# Patient Record
Sex: Male | Born: 1955 | Race: White | Hispanic: No | Marital: Married | Smoking: Never smoker
Health system: Southern US, Community
[De-identification: ages and names within clinical notes are randomized; demographics above are authoritative.]

## PROBLEM LIST (undated history)

## (undated) DIAGNOSIS — C801 Malignant (primary) neoplasm, unspecified: Secondary | ICD-10-CM

## (undated) DIAGNOSIS — D510 Vitamin B12 deficiency anemia due to intrinsic factor deficiency: Secondary | ICD-10-CM

## (undated) HISTORY — DX: Vitamin B12 deficiency anemia due to intrinsic factor deficiency: D51.0

## (undated) HISTORY — PX: CHOLECYSTECTOMY: SHX55

## (undated) HISTORY — PX: KNEE ARTHROSCOPY: SUR90

## (undated) HISTORY — PX: XI ROBOTIC ASSISTED SIMPLE PROSTATECTOMY: SHX6713

## (undated) HISTORY — DX: Malignant (primary) neoplasm, unspecified: C80.1

---

## 2002-10-18 ENCOUNTER — Encounter: Payer: Self-pay | Admitting: Family Medicine

## 2002-10-18 ENCOUNTER — Ambulatory Visit (HOSPITAL_COMMUNITY): Admission: RE | Admit: 2002-10-18 | Discharge: 2002-10-18 | Payer: Self-pay | Admitting: Family Medicine

## 2006-08-19 ENCOUNTER — Ambulatory Visit (HOSPITAL_COMMUNITY): Admission: RE | Admit: 2006-08-19 | Discharge: 2006-08-19 | Payer: Self-pay | Admitting: Family Medicine

## 2006-08-27 ENCOUNTER — Ambulatory Visit: Payer: Self-pay | Admitting: Orthopedic Surgery

## 2006-08-31 ENCOUNTER — Ambulatory Visit: Payer: Self-pay | Admitting: Orthopedic Surgery

## 2006-09-01 ENCOUNTER — Ambulatory Visit (HOSPITAL_COMMUNITY): Admission: RE | Admit: 2006-09-01 | Discharge: 2006-09-01 | Payer: Self-pay | Admitting: Orthopedic Surgery

## 2006-09-03 ENCOUNTER — Ambulatory Visit: Payer: Self-pay | Admitting: Orthopedic Surgery

## 2006-09-04 ENCOUNTER — Ambulatory Visit (HOSPITAL_COMMUNITY): Admission: RE | Admit: 2006-09-04 | Discharge: 2006-09-04 | Payer: Self-pay | Admitting: Orthopedic Surgery

## 2006-09-04 ENCOUNTER — Ambulatory Visit: Payer: Self-pay | Admitting: Orthopedic Surgery

## 2006-09-04 ENCOUNTER — Encounter: Payer: Self-pay | Admitting: Orthopedic Surgery

## 2006-09-08 ENCOUNTER — Ambulatory Visit: Payer: Self-pay | Admitting: Orthopedic Surgery

## 2006-09-09 ENCOUNTER — Encounter (HOSPITAL_COMMUNITY): Admission: RE | Admit: 2006-09-09 | Discharge: 2006-10-09 | Payer: Self-pay | Admitting: Orthopedic Surgery

## 2006-09-23 ENCOUNTER — Ambulatory Visit: Payer: Self-pay | Admitting: Orthopedic Surgery

## 2006-10-13 ENCOUNTER — Encounter (HOSPITAL_COMMUNITY): Admission: RE | Admit: 2006-10-13 | Discharge: 2006-11-10 | Payer: Self-pay | Admitting: Orthopedic Surgery

## 2006-10-21 ENCOUNTER — Ambulatory Visit: Payer: Self-pay | Admitting: Orthopedic Surgery

## 2006-12-21 ENCOUNTER — Ambulatory Visit: Payer: Self-pay | Admitting: Orthopedic Surgery

## 2006-12-21 DIAGNOSIS — M1220 Villonodular synovitis (pigmented), unspecified site: Secondary | ICD-10-CM | POA: Insufficient documentation

## 2009-12-11 DIAGNOSIS — C801 Malignant (primary) neoplasm, unspecified: Secondary | ICD-10-CM

## 2009-12-11 HISTORY — DX: Malignant (primary) neoplasm, unspecified: C80.1

## 2010-02-20 ENCOUNTER — Ambulatory Visit (HOSPITAL_COMMUNITY)
Admission: RE | Admit: 2010-02-20 | Discharge: 2010-02-20 | Payer: Self-pay | Source: Home / Self Care | Attending: Urology | Admitting: Urology

## 2010-02-25 LAB — SURGICAL PCR SCREEN
MRSA, PCR: NEGATIVE
Staphylococcus aureus: NEGATIVE

## 2010-03-22 ENCOUNTER — Inpatient Hospital Stay (HOSPITAL_COMMUNITY): Payer: BC Managed Care – PPO | Attending: Urology

## 2010-03-22 LAB — CBC
HCT: 43.4 % (ref 39.0–52.0)
Hemoglobin: 15.2 g/dL (ref 13.0–17.0)
MCH: 30.4 pg (ref 26.0–34.0)
MCHC: 35 g/dL (ref 30.0–36.0)
MCV: 86.8 fL (ref 78.0–100.0)
Platelets: 267 10*3/uL (ref 150–400)
RBC: 5 MIL/uL (ref 4.22–5.81)
RDW: 12.6 % (ref 11.5–15.5)
WBC: 5.2 10*3/uL (ref 4.0–10.5)

## 2010-03-22 LAB — BASIC METABOLIC PANEL
BUN: 12 mg/dL (ref 6–23)
CO2: 30 mEq/L (ref 19–32)
Calcium: 9.7 mg/dL (ref 8.4–10.5)
Chloride: 104 mEq/L (ref 96–112)
Creatinine, Ser: 1.1 mg/dL (ref 0.4–1.5)
GFR calc Af Amer: >60 mL/min (ref >60)
GFR calc non Af Amer: >60 mL/min (ref >60)
Glucose, Bld: 99 mg/dL (ref 70–99)
Potassium: 4.8 mEq/L (ref 3.5–5.1)
Sodium: 140 mEq/L (ref 135–145)

## 2010-03-22 LAB — SURGICAL PCR SCREEN
MRSA, PCR: NEGATIVE
Staphylococcus aureus: NEGATIVE

## 2010-04-01 ENCOUNTER — Inpatient Hospital Stay (HOSPITAL_COMMUNITY)
Admission: RE | Admit: 2010-04-01 | Discharge: 2010-04-02 | DRG: 335 | Disposition: A | Discharge status: Home / Self Care | Payer: BC Managed Care – PPO | Source: Ambulatory Visit | Attending: Urology | Admitting: Urology

## 2010-04-01 ENCOUNTER — Other Ambulatory Visit: Payer: Self-pay | Admitting: Urology

## 2010-04-01 DIAGNOSIS — C61 Malignant neoplasm of prostate: Principal | ICD-10-CM | POA: Diagnosis present

## 2010-04-01 LAB — TYPE AND SCREEN
ABO/RH(D): B POS
Antibody Screen: NEGATIVE

## 2010-04-01 LAB — HEMOGLOBIN AND HEMATOCRIT, BLOOD
HCT: 39.3 % (ref 39.0–52.0)
Hemoglobin: 13.3 g/dL (ref 13.0–17.0)

## 2010-04-01 LAB — ABO/RH: ABO/RH(D): B POS

## 2010-04-02 LAB — HEMOGLOBIN AND HEMATOCRIT, BLOOD
HCT: 41.9 % (ref 39.0–52.0)
Hemoglobin: 14 g/dL (ref 13.0–17.0)

## 2010-04-06 NOTE — Op Note (Signed)
NAME:  Kenneth Fritz, Kenneth Fritz NO.:  [id number]  MEDICAL RECORD NO.:  [id number]           PATIENT TYPE:  I  LOCATION:  0009                         FACILITY:  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  PROCEDURE:  Robotic-assisted laparoscopic radical prostatectomy (bilateral nerve sparing).  SURGEON:  Heloise Purpura, MD  ASSISTANT:  Delia Chimes, NP  ANESTHESIA:  General.  COMPLICATIONS:  None.  ESTIMATED BLOOD LOSS:  50 cc.  INTRAVENOUS FLUIDS:  2 L of lactated Ringer's.  SPECIMENS:  Prostate seminal vesicles.  DISPOSITION:  Specimen to pathology.  DRAINS: 1. A 20-French Coude catheter. 2. A #19 Blake pelvic drain.  INDICATIONS:  Kenneth Fritz is a 55 year old gentleman with clinically localized prostate cancer.  After a discussion regarding management options for treatment, he elected to proceed with surgical therapy and the above procedure.  The potential risks, complications, and alternative treatment options were discussed in detail and informed consent was obtained.  DESCRIPTION OF PROCEDURE:  The patient was taken to the operating room and a general anesthetic was administered.  He was given preoperative antibiotics, placed in the dorsal lithotomy position, and prepped and draped in the usual sterile fashion.  Next, preoperative time-out was performed.  A Foley catheter was then inserted into the bladder.  A site was selected just superior to the umbilicus for placement of the camera port.  This was placed using a standard open Hassan technique which allowed entry into the peritoneal cavity under direct vision  without difficulty.  A pneumoperitoneum was then established after 12 mm port was placed and the 0-degrees lens was used to inspect the abdomen. There was no evidence of any intra-abdominal injuries or other abnormalities.  The remaining ports were then placed.  The 8-mm robotic ports were placed in the left lower quadrant, right lower quadrant, and far left lower quadrant.  A 5 mm port was placed in the right upper quadrant and a 12 mm port was placed in the far right lateral abdominal wall for laparoscopic assistance.  All ports were placed under direct vision without difficulty.  The surgical cart was then docked.  With the aid of the cautery scissors, the bladder was reflected posteriorly allowing entry into space of Retzius and identification of the endopelvic fascia and prostate.  The endopelvic fascia was incised from the apex back to the base of the prostate bilaterally and the underlying levator muscle fibers were swept laterally off the prostate thereby isolating the dorsal vein.  The dorsal venous complex was then stapled and divided with a 45-mm flex Echelon stapler.  The bladder neck was identified with the aid of Foley catheter manipulation and was divided anteriorly thereby exposing the Foley catheter.  The catheter balloon was deflated and the catheter was brought into the operative field and used to retract the  prostate anteriorly.  The posterior bladder neck was then divided and dissection proceeded between the bladder and prostate until the vasa deferentia and seminal vesicles were identified.  The vasa deferentia were isolated, divided and lifted anteriorly.  The seminal vesicles and dissected down to their tips with care to control the seminal vesicle arterial blood supply.  The seminal vesicles were then also lifted anteriorly in the space between Denonvilliers fascia and the anterior rectum was bluntly developed thereby isolating the vascular pedicles of prostate.   The lateral prostatic fascia was then sharply incised allowing preservation of the neurovascular bundles bilaterally.  The vascular pedicles of prostate were then ligated with Hem-o-lok clips above the level of the neurovascular bundles and divided with sharp cold scissor dissection.  The neurovascular bundles were then swept off the apex of the prostate and urethra and urethra was sharply transected allowing the prostate specimen to be disarticulated.  The pelvis was then copiously irrigated and hemostasis was ensured. Attention then turned to the urethral anastomosis.  A 2-0 Vicryl slip- knot was placed between Denonvilliers fascia, the poster bladder neck, the posterior urethra to reapproximate these structures.  A double-armed 3-0 Monocryl suture was then used to perform a 360-degree running tension-free anastomosis between the bladder neck and urethra.  A new 20- [place] Coude catheter was inserted into the bladder and irrigated. There were no blood clots within the bladder and the anastomosis appeared to be watertight.  A #19 Blake drain was then brought through the left robotic port and positioned appropriately within the pelvis. It was secured to the skin with a nylon suture.  The surgical cart was then undocked.  The right lateral 12-mm port site was closed with a 0 Vicryl suture placed laparoscopically.  The remaining ports were then removed under direct vision and the prostate specimen was removed intact within the Endopouch retrieval bag via the periumbilical port site. This fascial opening was then closed with two running 0 Vicryl sutures. All port sites were injected with 0.25% Marcaine and reapproximated at the skin level with staples.  Sterile dressings were applied.  The patient appeared to tolerate the procedure well and without complications.  He was able to be extubated and transferred to the recovery unit in satisfactory condition.     Heloise Purpura,  MD     LB/MEDQ  D:  04/01/2010  T:  04/01/2010  Job:  [id number]  Electronically Signed by Heloise Purpura MD on 04/06/2010 01:17:23 PM

## 2010-06-25 NOTE — Op Note (Signed)
NAME:  [name], [name] NO.:  [id number]   MEDICAL RECORD NO.:  [id number]          PATIENT TYPE:  AMB   LOCATION:  DAY                           FACILITY:  [facility]   PHYSICIAN:  Vickki Hearing, M.D.DATE OF BIRTH:  Aug 19, 1955   DATE OF PROCEDURE:  09/04/2006  DATE OF DISCHARGE:                               OPERATIVE REPORT   HISTORY:  55 year old male presented with acute onset of pain in his  left calf which then progressed to his left knee and was associated with  warmth, swelling, loss of motion, and a fever.  He was referred to me by  Dr. Gerda Diss.  My workup included a CBC, sed rate, C-reactive protein,  knee aspiration, fluid analysis, cell count and culture.  His white  count was normal.  His sed rate and C-reactive protein were elevated.  His cell count showed 13,000 white cells, 95% polys, no crystals and  culture negative. After aspiration, he did not improve.  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   PREOPERATIVE DIAGNOSIS:  Pigmented villonodular synovitis, left knee.   POSTOPERATIVE DIAGNOSIS:  Pigmented villonodular synovitis, left knee.   PROCEDURE:  Arthroscopic synovectomy, extensive, left knee.   ANESTHESIA:  General.   SURGEON:  Vickki Hearing, M.D.   ASSISTANT:  None.   COUNTS:  Sponge and needle counts were correct at the end of the  procedure.   DISPOSITION:  The patient went to the recovery room at the end of the  procedure in stable condition.   OPERATIVE FINDINGS:  There were two large pedunculated masses in the  knee consistent with PVNS with hemosiderin deposits. There was an  extensive exudate and synovitis in the knee, as well.   SPECIMEN:  Multiple biopsies were taken of the two lesions and sent for  pathologic identification.   ESTIMATED BLOOD LOSS:  Minimal.   TOURNIQUET TIME:  75 minutes.   The patient was identified as Kenneth Fritz.  He marked the left  knee as the  surgical site and was countersigned. History and physical  was updated.  The patient was taken to the operating room for general  anesthetic.  Antibiotics were started.  The left leg was prepped with  DuraPrep and draped sterilely.  The time out procedure was completed.  Diagnostic arthroscopy was done through a lateral portal.  A medial  portal was established and a probe was placed in the knee.  The intra-  articular structures were found to be normal except for some mild  chondral changes of the lateral femoral condyle.  There was a large  pedunculated lesion in the notch area and one in the suprapatellar area.  These were removed using a combination of instruments which included a  grasper, a shaver, a duckbill forceps, an ArthroCare 90 degrees wand  from the Gomer system.   After extensive debridement in the medial gutter, we carried this across  the notch to the lateral gutter and we completed the suprapatellar pouch  resection and then we irrigated the knee and closed with 3-0 nylon  suture.  We did an accessory superolateral lateral portal to access the  suprapatellar pouch.  The patient was placed in a sterile dressing and  Cryo/Cuff. The tourniquet was released, the color of the foot was good,  the capillary refill was less than 2 seconds.  The patient was taken to  the recovery room in stable condition.  He is weight bearing as  tolerated.  He is discharged on Vicodin and Phenergan.      Vickki Hearing, M.D.  Electronically Signed     SEH/MEDQ  D:  09/04/2006  T:  09/05/2006  Job:  [id number]

## 2010-06-25 NOTE — H&P (Signed)
NAME:  Kenneth Fritz, Kenneth Fritz NO.:  [id number]   MEDICAL RECORD NO.:  [id number]          PATIENT TYPE:  AMB   LOCATION:  DAY                           FACILITY:  [facility]   PHYSICIAN:  Vickki Hearing, M.D.DATE OF BIRTH:  1955/06/04   DATE OF ADMISSION:  09/04/2006  DATE OF DISCHARGE:  LH                              HISTORY & PHYSICAL   REFERRING PHYSICIAN:  Dr. Gerda Diss.   CHIEF COMPLAINT:  Pain and swelling, left knee.   HISTORY:  This is a 55 year old male with pain and swelling in the  posterior aspect of the left knee which started in his calf, then  progressed to the knee.  He has now had this for approximately a week  and a half.  He had a low grade fever.  His pain was rated a 10.  He had  an ultrasound which was negative.  He was given oral steroids and  Augmentin as well as ibuprofen, and he did not improve.  I aspirated his  knee, a cloudy nonpurulent fluid was sent for culture which was  negative.  Cell count analysis and crystal analysis:  The fluid came  back as 13,745 white cells, 95% neutrophils.  Again, no crystals.  Sed  rate 20.  C-reactive protein 2.7.  White count 7.4, granulocytes 75%.   I sent the patient for an MRI.  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  I spoke with Dr. Nicole Cella about this  at [facility].  He agreed an arthroscopic synovectomy is warranted.   REVIEW OF SYSTEMS:  GENERAL:  He did have a fever.  No chest pain, no  shortness of breath, no nausea, no hematuria, numbness, polydipsia,  depression, or skin cancer.  No known drug allergies.   PAST MEDICAL HISTORY:  He is B12 deficient.   SURGICAL HISTORY:  Cholecystectomy.   FAMILY HISTORY:  Cancer, diabetes.   SOCIAL HISTORY:  He is married.  He is a Retail banker.  He does  not smoke, drink, or use caffeine.   PHYSICAL EXAMINATION:  VITAL SIGNS:  Weight is approximately 153, pulse  96, respiratory rate 18.  APPEARANCE:  Normal.  CARDIOVASCULAR:  Showed normal pulses and no swelling.  LYMPH NODES:  Negative in the groin and neck.  SKIN:  Normal on 4 extremities.  PSYCH:  Normal mood, normal orientation.  NEUROLOGIC:  Normal reflexes, sensation, coordination.  MUSCULOSKELETAL:  He is limping.  He favors the left leg.  He does not  have full range of motion.  He has an effusion.  His ligaments were  intact.  Joint was tender, warm, swollen.  Upper extremities and right  lower extremity stability, strength, appearance were normal.   X-rays were negative.   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   PLAN:  Arthroscopic debridement synovectomy.  Most likely we will need  multiple portals.  Plan is for left knee arthroscopy.   DIAGNOSES:  Pigmented villonodular synovitis of the left knee.      Vickki Hearing, M.D.  Electronically Signed     SEH/MEDQ  D:  09/03/2006  T:  09/03/2006  Job:  [id number]   cc:   [facility]  Fax: [phone]

## 2010-11-25 LAB — CBC
HCT: 41.3
Hemoglobin: 14
MCHC: 33.9
MCV: 87
Platelets: 575 — ABNORMAL HIGH
RBC: 4.75
RDW: 12.6
WBC: 7.7

## 2012-05-10 ENCOUNTER — Encounter: Payer: Self-pay | Admitting: *Deleted

## 2012-05-12 ENCOUNTER — Ambulatory Visit (INDEPENDENT_AMBULATORY_CARE_PROVIDER_SITE_OTHER): Payer: BC Managed Care – PPO | Admitting: *Deleted

## 2012-05-12 DIAGNOSIS — D539 Nutritional anemia, unspecified: Secondary | ICD-10-CM

## 2012-05-12 MED ORDER — CYANOCOBALAMIN 1000 MCG/ML IJ SOLN
1000.0000 ug | Freq: Once | INTRAMUSCULAR | Status: AC
  Administered 2012-05-12: 1000 ug via INTRAMUSCULAR

## 2012-07-16 ENCOUNTER — Ambulatory Visit (INDEPENDENT_AMBULATORY_CARE_PROVIDER_SITE_OTHER): Payer: BC Managed Care – PPO

## 2012-07-16 DIAGNOSIS — D649 Anemia, unspecified: Secondary | ICD-10-CM

## 2012-07-16 MED ORDER — CYANOCOBALAMIN 1000 MCG/ML IJ SOLN
1000.0000 ug | Freq: Once | INTRAMUSCULAR | Status: AC
  Administered 2012-07-16: 1000 ug via INTRAMUSCULAR

## 2012-08-24 ENCOUNTER — Ambulatory Visit (INDEPENDENT_AMBULATORY_CARE_PROVIDER_SITE_OTHER): Payer: BC Managed Care – PPO

## 2012-08-24 DIAGNOSIS — D649 Anemia, unspecified: Secondary | ICD-10-CM

## 2012-08-24 MED ORDER — CYANOCOBALAMIN 1000 MCG/ML IJ SOLN
1000.0000 ug | Freq: Once | INTRAMUSCULAR | Status: AC
  Administered 2012-08-24: 1000 ug via INTRAMUSCULAR

## 2012-09-28 ENCOUNTER — Ambulatory Visit (INDEPENDENT_AMBULATORY_CARE_PROVIDER_SITE_OTHER): Payer: BC Managed Care – PPO | Admitting: *Deleted

## 2012-09-28 DIAGNOSIS — D649 Anemia, unspecified: Secondary | ICD-10-CM

## 2012-09-28 MED ORDER — CYANOCOBALAMIN 1000 MCG/ML IJ SOLN
1000.0000 ug | Freq: Once | INTRAMUSCULAR | Status: AC
  Administered 2012-09-28: 1000 ug via INTRAMUSCULAR

## 2012-10-29 ENCOUNTER — Ambulatory Visit: Payer: BC Managed Care – PPO

## 2012-11-02 ENCOUNTER — Ambulatory Visit (INDEPENDENT_AMBULATORY_CARE_PROVIDER_SITE_OTHER): Payer: BC Managed Care – PPO | Admitting: *Deleted

## 2012-11-02 DIAGNOSIS — D649 Anemia, unspecified: Secondary | ICD-10-CM

## 2012-11-02 MED ORDER — CYANOCOBALAMIN 1000 MCG/ML IJ SOLN
1000.0000 ug | Freq: Once | INTRAMUSCULAR | Status: AC
  Administered 2012-11-02: 1000 ug via INTRAMUSCULAR

## 2012-11-10 ENCOUNTER — Encounter: Payer: Self-pay | Admitting: Family Medicine

## 2012-11-10 DIAGNOSIS — C61 Malignant neoplasm of prostate: Secondary | ICD-10-CM | POA: Insufficient documentation

## 2012-12-21 ENCOUNTER — Ambulatory Visit (INDEPENDENT_AMBULATORY_CARE_PROVIDER_SITE_OTHER): Payer: BC Managed Care – PPO | Admitting: *Deleted

## 2012-12-21 DIAGNOSIS — D649 Anemia, unspecified: Secondary | ICD-10-CM

## 2012-12-21 MED ORDER — CYANOCOBALAMIN 1000 MCG/ML IJ SOLN
1000.0000 ug | Freq: Once | INTRAMUSCULAR | Status: AC
  Administered 2012-12-21: 1000 ug via INTRAMUSCULAR

## 2013-02-15 ENCOUNTER — Ambulatory Visit (INDEPENDENT_AMBULATORY_CARE_PROVIDER_SITE_OTHER): Payer: BC Managed Care – PPO | Admitting: *Deleted

## 2013-02-15 DIAGNOSIS — D510 Vitamin B12 deficiency anemia due to intrinsic factor deficiency: Secondary | ICD-10-CM

## 2013-02-15 MED ORDER — CYANOCOBALAMIN 1000 MCG/ML IJ SOLN
1000.0000 ug | Freq: Once | INTRAMUSCULAR | Status: AC
  Administered 2013-02-15: 1000 ug via INTRAMUSCULAR

## 2013-04-18 ENCOUNTER — Ambulatory Visit (INDEPENDENT_AMBULATORY_CARE_PROVIDER_SITE_OTHER): Payer: BC Managed Care – PPO | Admitting: Family Medicine

## 2013-04-18 ENCOUNTER — Encounter: Payer: Self-pay | Admitting: Family Medicine

## 2013-04-18 VITALS — BP 144/90 | Ht 67.0 in | Wt 168.0 lb

## 2013-04-18 DIAGNOSIS — R350 Frequency of micturition: Secondary | ICD-10-CM

## 2013-04-18 DIAGNOSIS — M549 Dorsalgia, unspecified: Secondary | ICD-10-CM

## 2013-04-18 DIAGNOSIS — D510 Vitamin B12 deficiency anemia due to intrinsic factor deficiency: Secondary | ICD-10-CM | POA: Insufficient documentation

## 2013-04-18 LAB — POCT URINALYSIS DIPSTICK
Glucose, UA: 50
Spec Grav, UA: 1.02
pH, UA: 5

## 2013-04-18 LAB — GLUCOSE, POCT (MANUAL RESULT ENTRY): POC Glucose: 101 mg/dl — AB (ref 70–99)

## 2013-04-18 MED ORDER — CIPROFLOXACIN HCL 500 MG PO TABS: 500.0000 mg | ORAL_TABLET | Freq: Two times a day (BID) | ORAL | Status: DC

## 2013-04-18 MED ORDER — CYANOCOBALAMIN 1000 MCG/ML IJ SOLN
1000.0000 ug | Freq: Once | INTRAMUSCULAR | Status: AC
  Administered 2013-04-18: 1000 ug via INTRAMUSCULAR

## 2013-04-18 NOTE — Progress Notes (Signed)
   Subjective:    Patient ID: Kenneth Fritz, male    DOB: 04/14/1955, 58 y.o.   MRN: [id number]  Urinary Frequency  This is a new problem. The current episode started in the past 7 days. The problem occurs every urination (Except at night). The patient is experiencing no pain. There has been no fever. Associated symptoms include frequency. He has tried nothing for the symptoms.   PMH above/pernicious anemia needs his B12 shot   Review of Systems  Genitourinary: Positive for frequency.   denies fever chills vomiting diarrhea     Objective:   Physical Exam 140/80 Lungs clear hearts regular abdomen soft no guarding or rebound      Assessment & Plan:  #1 pernicious anemia B12 shot given #2 probable UTI culture taken antibiotics prescribed

## 2013-04-20 LAB — URINE CULTURE
Colony Count: NO GROWTH
Organism ID, Bacteria: NO GROWTH

## 2013-06-17 ENCOUNTER — Ambulatory Visit (INDEPENDENT_AMBULATORY_CARE_PROVIDER_SITE_OTHER): Payer: BC Managed Care – PPO | Admitting: *Deleted

## 2013-06-17 DIAGNOSIS — D649 Anemia, unspecified: Secondary | ICD-10-CM

## 2013-06-17 MED ORDER — CYANOCOBALAMIN 1000 MCG/ML IJ SOLN
1000.0000 ug | Freq: Once | INTRAMUSCULAR | Status: AC
  Administered 2013-06-17: 1000 ug via INTRAMUSCULAR

## 2013-08-05 ENCOUNTER — Ambulatory Visit (INDEPENDENT_AMBULATORY_CARE_PROVIDER_SITE_OTHER): Payer: BC Managed Care – PPO | Admitting: *Deleted

## 2013-08-05 DIAGNOSIS — D649 Anemia, unspecified: Secondary | ICD-10-CM

## 2013-08-05 MED ORDER — CYANOCOBALAMIN 1000 MCG/ML IJ SOLN
1000.0000 ug | Freq: Once | INTRAMUSCULAR | Status: AC
  Administered 2013-08-05: 1000 ug via INTRAMUSCULAR

## 2013-09-15 ENCOUNTER — Ambulatory Visit (INDEPENDENT_AMBULATORY_CARE_PROVIDER_SITE_OTHER): Payer: BC Managed Care – PPO | Admitting: Family Medicine

## 2013-09-15 ENCOUNTER — Encounter: Payer: Self-pay | Admitting: Family Medicine

## 2013-09-15 VITALS — BP 138/90 | Temp 98.3°F | Ht 67.0 in | Wt 162.0 lb

## 2013-09-15 DIAGNOSIS — D510 Vitamin B12 deficiency anemia due to intrinsic factor deficiency: Secondary | ICD-10-CM

## 2013-09-15 DIAGNOSIS — H8110 Benign paroxysmal vertigo, unspecified ear: Secondary | ICD-10-CM

## 2013-09-15 DIAGNOSIS — H8113 Benign paroxysmal vertigo, bilateral: Secondary | ICD-10-CM

## 2013-09-15 MED ORDER — MECLIZINE HCL 25 MG PO TABS: 25.0000 mg | ORAL_TABLET | Freq: Three times a day (TID) | ORAL | Status: DC | PRN

## 2013-09-15 MED ORDER — CYANOCOBALAMIN 1000 MCG/ML IJ SOLN
1000.0000 ug | Freq: Once | INTRAMUSCULAR | Status: AC
  Administered 2013-09-15: 1000 ug via INTRAMUSCULAR

## 2013-09-15 NOTE — Progress Notes (Addendum)
   Subjective:    Patient ID: Kenneth Fritz, male    DOB: 01/30/1956, 58 y.o.   MRN: [id number]  Dizziness This is a recurrent (Has this about 2x per year) problem. The current episode started yesterday. The problem occurs intermittently. The problem has been gradually improving. Associated symptoms include myalgias and vertigo. Pertinent negatives include no chest pain, congestion, coughing, fatigue, fever, headaches, numbness or weakness. Associated symptoms comments: Ear congestion. Exacerbated by: laying down. He has tried nothing for the symptoms.   Worse while lying down and rolling over, improving today   Review of Systems  Constitutional: Negative for fever and fatigue.  HENT: Negative for congestion.   Respiratory: Negative for cough.   Cardiovascular: Negative for chest pain.  Musculoskeletal: Positive for myalgias.  Neurological: Positive for dizziness and vertigo. Negative for tremors, seizures, syncope, facial asymmetry, speech difficulty, weakness, light-headedness, numbness and headaches.  Hematological: Negative for adenopathy.  Psychiatric/Behavioral: Negative for behavioral problems.       Objective:   Physical Exam  Vitals reviewed. Constitutional: He is oriented to person, place, and time. He appears well-nourished. No distress.  HENT:  Head: Normocephalic.  Right Ear: External ear normal.  Left Ear: External ear normal.  Eyes: EOM are normal. Pupils are equal, round, and reactive to light.  Cardiovascular: Normal rate, regular rhythm and normal heart sounds.   No murmur heard. Pulmonary/Chest: Effort normal and breath sounds normal. No respiratory distress.  Musculoskeletal: He exhibits no edema.  Lymphadenopathy:    He has no cervical adenopathy.  Neurological: He is alert and oriented to person, place, and time. He has normal reflexes. He displays normal reflexes. No cranial nerve deficit. He exhibits normal muscle tone. Coordination normal.    Psychiatric: His behavior is normal.          Assessment & Plan:  BPV-vertigo- meclizine/epley manuver No stroke SX- warnings discussed Pt totally seen by me,Dr Sallee Lange

## 2013-10-28 ENCOUNTER — Ambulatory Visit (INDEPENDENT_AMBULATORY_CARE_PROVIDER_SITE_OTHER): Payer: BC Managed Care – PPO | Admitting: *Deleted

## 2013-10-28 DIAGNOSIS — D510 Vitamin B12 deficiency anemia due to intrinsic factor deficiency: Secondary | ICD-10-CM

## 2013-10-28 MED ORDER — CYANOCOBALAMIN 1000 MCG/ML IJ SOLN
1000.0000 ug | Freq: Once | INTRAMUSCULAR | Status: AC
  Administered 2013-10-28: 1000 ug via INTRAMUSCULAR

## 2013-12-16 ENCOUNTER — Ambulatory Visit (INDEPENDENT_AMBULATORY_CARE_PROVIDER_SITE_OTHER): Payer: BC Managed Care – PPO | Admitting: *Deleted

## 2013-12-16 DIAGNOSIS — D649 Anemia, unspecified: Secondary | ICD-10-CM

## 2013-12-16 MED ORDER — CYANOCOBALAMIN 1000 MCG/ML IJ SOLN
1000.0000 ug | Freq: Once | INTRAMUSCULAR | Status: AC
  Administered 2013-12-16: 1000 ug via INTRAMUSCULAR

## 2014-02-16 ENCOUNTER — Ambulatory Visit (INDEPENDENT_AMBULATORY_CARE_PROVIDER_SITE_OTHER): Payer: BLUE CROSS/BLUE SHIELD | Admitting: *Deleted

## 2014-02-16 DIAGNOSIS — D510 Vitamin B12 deficiency anemia due to intrinsic factor deficiency: Secondary | ICD-10-CM

## 2014-02-16 MED ORDER — CYANOCOBALAMIN 1000 MCG/ML IJ SOLN
1000.0000 ug | Freq: Once | INTRAMUSCULAR | Status: AC
  Administered 2014-02-16: 1000 ug via INTRAMUSCULAR

## 2014-04-06 ENCOUNTER — Ambulatory Visit (INDEPENDENT_AMBULATORY_CARE_PROVIDER_SITE_OTHER): Payer: BLUE CROSS/BLUE SHIELD | Admitting: *Deleted

## 2014-04-06 DIAGNOSIS — D649 Anemia, unspecified: Secondary | ICD-10-CM

## 2014-04-06 MED ORDER — CYANOCOBALAMIN 1000 MCG/ML IJ SOLN
1000.0000 ug | Freq: Once | INTRAMUSCULAR | Status: AC
  Administered 2014-04-06: 1000 ug via INTRAMUSCULAR

## 2014-05-16 ENCOUNTER — Ambulatory Visit (INDEPENDENT_AMBULATORY_CARE_PROVIDER_SITE_OTHER): Payer: BLUE CROSS/BLUE SHIELD | Admitting: *Deleted

## 2014-05-16 DIAGNOSIS — D510 Vitamin B12 deficiency anemia due to intrinsic factor deficiency: Secondary | ICD-10-CM

## 2014-05-16 MED ORDER — CYANOCOBALAMIN 1000 MCG/ML IJ SOLN
1000.0000 ug | Freq: Once | INTRAMUSCULAR | Status: AC
  Administered 2014-05-16: 1000 ug via INTRAMUSCULAR

## 2014-06-19 ENCOUNTER — Ambulatory Visit (INDEPENDENT_AMBULATORY_CARE_PROVIDER_SITE_OTHER): Payer: BLUE CROSS/BLUE SHIELD | Admitting: Family Medicine

## 2014-06-19 ENCOUNTER — Encounter: Payer: Self-pay | Admitting: Family Medicine

## 2014-06-19 VITALS — BP 132/80 | Temp 99.3°F | Ht 67.0 in | Wt 159.0 lb

## 2014-06-19 DIAGNOSIS — Z131 Encounter for screening for diabetes mellitus: Secondary | ICD-10-CM | POA: Diagnosis not present

## 2014-06-19 DIAGNOSIS — Z13220 Encounter for screening for lipoid disorders: Secondary | ICD-10-CM | POA: Diagnosis not present

## 2014-06-19 DIAGNOSIS — J019 Acute sinusitis, unspecified: Secondary | ICD-10-CM

## 2014-06-19 DIAGNOSIS — D510 Vitamin B12 deficiency anemia due to intrinsic factor deficiency: Secondary | ICD-10-CM | POA: Diagnosis not present

## 2014-06-19 DIAGNOSIS — B9689 Other specified bacterial agents as the cause of diseases classified elsewhere: Secondary | ICD-10-CM

## 2014-06-19 MED ORDER — CEFPROZIL 500 MG PO TABS: 500.0000 mg | ORAL_TABLET | Freq: Two times a day (BID) | ORAL | Status: DC

## 2014-06-19 MED ORDER — CYANOCOBALAMIN 1000 MCG/ML IJ SOLN
1000.0000 ug | Freq: Once | INTRAMUSCULAR | Status: AC
Start: 2014-06-19 — End: 2014-06-19
  Administered 2014-06-19: 1000 ug via INTRAMUSCULAR

## 2014-06-19 NOTE — Patient Instructions (Addendum)
Consider generic Loratadine 10 mg  Dear Patient,  It has been recommended to you that you have a colonoscopy. It is your responsibility to carry through with this recommendation.   Did you realize that colon cancer is the second leading cancer killer in the [place]. One in every 20 adults will get colon cancer. If all adults would go through the recommended screening for colon cancer (getting a colonoscopy), then there would be a 60% reduction in the number of people dying from colon cancer.  Colon cancer just doesn't come out of the blue. It starts off as a small polyp which over time grows into a cancer. A colonoscopy can prevent cancer and in many cases detected when it is at a very treatable phase. Small colon cancers can have cure rates of 95%. Advanced colon cancer, which often occurs in people who do not do their screenings, have cure rates less than 20%. The risk of colon cancer advances with age. Most adults should have regular colonoscopies every 10 years starting at age 82. This recommendation can vary depending on a person's medical history.  Health-care laws now allow for you to call the gastroenterologist office directly in order to set yourself up for this very important tests. Today we have recommended to you that you do this test. This test may save your life. Failure to do this test puts you at risk for premature death from colon cancer. Do the right thing and schedule this test now.  Here as a list of specialists we recommend in the surrounding area. When you call their office let them know that you are a patient of our practice in your interested in doing a screening colonoscopy. They should assist you without problems. You will need the following information when you called them: 1-name of which Dr. you see, 2-your insurance information, 3-a list of medications that you currently take, 4-any allergies you have to medications.  [facility] gastroenterologist Dr. Milton Ferguson, Dr  Felicie Morn gastroenterologist   [facility]   [phone]  [facility] gastroenterology (Dr. Garnetta Buddy and [place]) [phone]  [facility] gastroenterology (Dr. Leia Alf, Harrietta Guardian, [place]) [phone]  Each group of specialists has assured [place] that when you called them they will help you get your colonoscopy set up. Should you have problems please let us know. Be sure to call soon. Sincerely, Pearson Forster, Dr Mickie Hillier, [place]

## 2014-06-19 NOTE — Progress Notes (Signed)
   Subjective:    Patient ID: Kenneth Fritz, male    DOB: [birth year], 59 y.o.   MRN: [id number]  Cough This is a new problem. Episode onset: 3 days ago. Associated symptoms include headaches and rhinorrhea. Pertinent negatives include no chest pain, ear pain, fever or wheezing. Associated symptoms comments: Burning in chest, dizziness . Treatments tried: mucinex.   Patient due for his B12 shot   Review of Systems  Constitutional: Negative for fever and activity change.  HENT: Positive for congestion and rhinorrhea. Negative for ear pain.   Eyes: Negative for discharge.  Respiratory: Positive for cough. Negative for wheezing.   Cardiovascular: Negative for chest pain.  Neurological: Positive for headaches.       Objective:   Physical Exam  Constitutional: He appears well-developed.  HENT:  Head: Normocephalic.  Mouth/Throat: Oropharynx is clear and moist. No oropharyngeal exudate.  Neck: Normal range of motion.  Cardiovascular: Normal rate, regular rhythm and normal heart sounds.   No murmur heard. Pulmonary/Chest: Effort normal and breath sounds normal. He has no wheezes.  Lymphadenopathy:    He has no cervical adenopathy.  Neurological: He exhibits normal muscle tone.  Skin: Skin is warm and dry.  Nursing note and vitals reviewed.         Assessment & Plan:  Pernicious anemia B12 given Upper respiratory illness secondary sinusitis antibiotics prescribed warning signs discussed.

## 2014-06-20 ENCOUNTER — Ambulatory Visit: Payer: BLUE CROSS/BLUE SHIELD

## 2014-06-27 ENCOUNTER — Ambulatory Visit (INDEPENDENT_AMBULATORY_CARE_PROVIDER_SITE_OTHER): Payer: BLUE CROSS/BLUE SHIELD | Admitting: Family Medicine

## 2014-06-27 ENCOUNTER — Encounter: Payer: Self-pay | Admitting: Family Medicine

## 2014-06-27 VITALS — BP 144/86 | Temp 97.9°F | Ht 67.0 in | Wt 168.0 lb

## 2014-06-27 DIAGNOSIS — J019 Acute sinusitis, unspecified: Secondary | ICD-10-CM

## 2014-06-27 DIAGNOSIS — B9689 Other specified bacterial agents as the cause of diseases classified elsewhere: Secondary | ICD-10-CM

## 2014-06-27 DIAGNOSIS — J208 Acute bronchitis due to other specified organisms: Secondary | ICD-10-CM

## 2014-06-27 MED ORDER — AZITHROMYCIN 250 MG PO TABS: ORAL_TABLET | ORAL | Status: DC

## 2014-06-27 MED ORDER — HYDROCODONE-HOMATROPINE 5-1.5 MG/5ML PO SYRP: 5.0000 mL | ORAL_SOLUTION | Freq: Four times a day (QID) | ORAL | Status: DC | PRN

## 2014-06-27 NOTE — Progress Notes (Signed)
   Subjective:    Patient ID: Kenneth Fritz, male    DOB: May 08, 1955, 59 y.o.   MRN: [id number]  Cough This is a new problem. The current episode started 1 to 4 weeks ago. Associated symptoms include a fever, headaches, nasal congestion, rhinorrhea and a sore throat. Pertinent negatives include no chest pain, ear pain or wheezing. Associated symptoms comments: Antibiotic, Cough Syrup. Nothing aggravates the symptoms. Treatments tried: Antbiotic, Cough Syrup. The treatment provided no relief.      Review of Systems  Constitutional: Positive for fever. Negative for activity change.  HENT: Positive for congestion, rhinorrhea and sore throat. Negative for ear pain.   Eyes: Negative for discharge.  Respiratory: Positive for cough. Negative for wheezing.   Cardiovascular: Negative for chest pain.  Neurological: Positive for headaches.       Objective:   Physical Exam  Constitutional: He appears well-developed.  HENT:  Head: Normocephalic.  Mouth/Throat: Oropharynx is clear and moist. No oropharyngeal exudate.  Neck: Normal range of motion.  Cardiovascular: Normal rate, regular rhythm and normal heart sounds.   No murmur heard. Pulmonary/Chest: Effort normal and breath sounds normal. He has no wheezes.  Lymphadenopathy:    He has no cervical adenopathy.  Neurological: He exhibits normal muscle tone.  Skin: Skin is warm and dry.  Nursing note and vitals reviewed.         Assessment & Plan:  Viral syndrome Secondary sinusitis Second round of antibiotics Warning signs discussed Should gradually get better Cough medication for nighttime use caution drowsiness

## 2014-07-20 ENCOUNTER — Ambulatory Visit (INDEPENDENT_AMBULATORY_CARE_PROVIDER_SITE_OTHER): Payer: BLUE CROSS/BLUE SHIELD | Admitting: *Deleted

## 2014-07-20 DIAGNOSIS — D510 Vitamin B12 deficiency anemia due to intrinsic factor deficiency: Secondary | ICD-10-CM | POA: Diagnosis not present

## 2014-07-20 MED ORDER — CYANOCOBALAMIN 1000 MCG/ML IJ SOLN
1000.0000 ug | Freq: Once | INTRAMUSCULAR | Status: AC
  Administered 2014-07-20: 1000 ug via INTRAMUSCULAR

## 2014-09-06 ENCOUNTER — Ambulatory Visit (INDEPENDENT_AMBULATORY_CARE_PROVIDER_SITE_OTHER): Payer: BLUE CROSS/BLUE SHIELD | Admitting: *Deleted

## 2014-09-06 DIAGNOSIS — D649 Anemia, unspecified: Secondary | ICD-10-CM | POA: Diagnosis not present

## 2014-09-06 MED ORDER — CYANOCOBALAMIN 1000 MCG/ML IJ SOLN
1000.0000 ug | Freq: Once | INTRAMUSCULAR | Status: AC
  Administered 2014-09-06: 1000 ug via INTRAMUSCULAR

## 2014-10-19 ENCOUNTER — Ambulatory Visit (INDEPENDENT_AMBULATORY_CARE_PROVIDER_SITE_OTHER): Payer: BLUE CROSS/BLUE SHIELD | Admitting: *Deleted

## 2014-10-19 DIAGNOSIS — D649 Anemia, unspecified: Secondary | ICD-10-CM | POA: Diagnosis not present

## 2014-10-19 MED ORDER — CYANOCOBALAMIN 1000 MCG/ML IJ SOLN
1000.0000 ug | Freq: Once | INTRAMUSCULAR | Status: AC
  Administered 2014-10-19: 1000 ug via INTRAMUSCULAR

## 2014-11-22 ENCOUNTER — Ambulatory Visit (INDEPENDENT_AMBULATORY_CARE_PROVIDER_SITE_OTHER): Payer: BLUE CROSS/BLUE SHIELD

## 2014-11-22 DIAGNOSIS — D649 Anemia, unspecified: Secondary | ICD-10-CM | POA: Diagnosis not present

## 2014-11-22 MED ORDER — CYANOCOBALAMIN 1000 MCG/ML IJ SOLN
1000.0000 ug | Freq: Once | INTRAMUSCULAR | Status: AC
  Administered 2014-11-22: 1000 ug via INTRAMUSCULAR

## 2014-12-26 ENCOUNTER — Encounter: Payer: Self-pay | Admitting: Family Medicine

## 2014-12-26 ENCOUNTER — Ambulatory Visit (INDEPENDENT_AMBULATORY_CARE_PROVIDER_SITE_OTHER): Payer: BLUE CROSS/BLUE SHIELD | Admitting: Family Medicine

## 2014-12-26 VITALS — Temp 98.9°F | Ht 67.0 in | Wt 159.6 lb

## 2014-12-26 DIAGNOSIS — B9689 Other specified bacterial agents as the cause of diseases classified elsewhere: Secondary | ICD-10-CM

## 2014-12-26 DIAGNOSIS — J019 Acute sinusitis, unspecified: Secondary | ICD-10-CM

## 2014-12-26 MED ORDER — AMOXICILLIN-POT CLAVULANATE 875-125 MG PO TABS: 1.0000 | ORAL_TABLET | Freq: Two times a day (BID) | ORAL | Status: DC

## 2014-12-26 NOTE — Progress Notes (Signed)
   Subjective:    Patient ID: Kenneth Fritz, male    DOB: 1956/02/02, 59 y.o.   MRN: [id number]  Sinus Problem This is a new problem. The current episode started in the past 7 days. Associated symptoms include congestion, headaches, sinus pressure and a sore throat. Treatments tried: zyrtec, advil sinus.   [place] exposure  Dev sore throat  Head stopped up, sore throat didn't go away  stil stopped up   No hi fever, some nose running , used zyrtec and ad sin [place]  Non smoker  Review of Systems  HENT: Positive for congestion, sinus pressure and sore throat.   Neurological: Positive for headaches.   No high fever chills    Objective:   Physical Exam  Alert mild malaise frontal maxillary tenderness evident. Vitals reviewed. Pharynx some erythema neck supple lungs clear heart regular rate and rhythm.      Assessment & Plan:  Impression post viral rhinosinusitis plan antibiotics prescribed. Symptom care discussed. Warning signs discussed WSL

## 2015-01-09 ENCOUNTER — Ambulatory Visit (INDEPENDENT_AMBULATORY_CARE_PROVIDER_SITE_OTHER): Payer: BLUE CROSS/BLUE SHIELD | Admitting: *Deleted

## 2015-01-09 DIAGNOSIS — D649 Anemia, unspecified: Secondary | ICD-10-CM | POA: Diagnosis not present

## 2015-01-09 DIAGNOSIS — Z23 Encounter for immunization: Secondary | ICD-10-CM | POA: Diagnosis not present

## 2015-01-09 MED ORDER — CYANOCOBALAMIN 1000 MCG/ML IJ SOLN
1000.0000 ug | Freq: Once | INTRAMUSCULAR | Status: AC
  Administered 2015-01-09: 1000 ug via INTRAMUSCULAR

## 2015-03-22 ENCOUNTER — Ambulatory Visit (INDEPENDENT_AMBULATORY_CARE_PROVIDER_SITE_OTHER): Payer: BLUE CROSS/BLUE SHIELD

## 2015-03-22 DIAGNOSIS — D649 Anemia, unspecified: Secondary | ICD-10-CM | POA: Diagnosis not present

## 2015-03-22 MED ORDER — CYANOCOBALAMIN 1000 MCG/ML IJ SOLN
1000.0000 ug | Freq: Once | INTRAMUSCULAR | Status: AC
  Administered 2015-03-22: 1000 ug via INTRAMUSCULAR

## 2015-04-27 ENCOUNTER — Ambulatory Visit (INDEPENDENT_AMBULATORY_CARE_PROVIDER_SITE_OTHER): Payer: BLUE CROSS/BLUE SHIELD | Admitting: *Deleted

## 2015-04-27 DIAGNOSIS — D510 Vitamin B12 deficiency anemia due to intrinsic factor deficiency: Secondary | ICD-10-CM

## 2015-04-27 MED ORDER — CYANOCOBALAMIN 1000 MCG/ML IJ SOLN
1000.0000 ug | Freq: Once | INTRAMUSCULAR | Status: AC
  Administered 2015-04-27: 1000 ug via INTRAMUSCULAR

## 2015-05-01 ENCOUNTER — Encounter: Payer: Self-pay | Admitting: Orthopedic Surgery

## 2015-05-01 ENCOUNTER — Ambulatory Visit (INDEPENDENT_AMBULATORY_CARE_PROVIDER_SITE_OTHER): Payer: BLUE CROSS/BLUE SHIELD

## 2015-05-01 ENCOUNTER — Ambulatory Visit (INDEPENDENT_AMBULATORY_CARE_PROVIDER_SITE_OTHER): Payer: BLUE CROSS/BLUE SHIELD | Admitting: Orthopedic Surgery

## 2015-05-01 VITALS — BP 136/82 | HR 73 | Ht 67.0 in | Wt 163.2 lb

## 2015-05-01 DIAGNOSIS — M75102 Unspecified rotator cuff tear or rupture of left shoulder, not specified as traumatic: Secondary | ICD-10-CM | POA: Diagnosis not present

## 2015-05-01 DIAGNOSIS — M25512 Pain in left shoulder: Secondary | ICD-10-CM

## 2015-05-01 MED ORDER — NAPROXEN 500 MG PO TABS: 500.0000 mg | ORAL_TABLET | Freq: Two times a day (BID) | ORAL | Status: DC

## 2015-05-01 NOTE — Patient Instructions (Addendum)
Joint Injection  Care After  Refer to this sheet in the next few days. These instructions provide you with information on caring for yourself after you have had a joint injection. Your caregiver also may give you more specific instructions. Your treatment has been planned according to current medical practices, but problems sometimes occur. Call your caregiver if you have any problems or questions after your procedure.  After any type of joint injection, it is not uncommon to experience:  Soreness, swelling, or bruising around the injection site.  Mild numbness, tingling, or weakness around the injection site caused by the numbing medicine used before or with the injection. It also is possible to experience the following effects associated with the specific agent after injection:  Iodine-based contrast agents:  Allergic reaction (itching, hives, widespread redness, and swelling beyond the injection site).  Corticosteroids (These effects are rare.):  Allergic reaction.  Increased blood sugar levels (If you have diabetes and you notice that your blood sugar levels have increased, notify your caregiver).  Increased blood pressure levels.  Mood swings.  Hyaluronic acid in the use of viscosupplementation.  Temporary heat or redness.  Temporary rash and itching.  Increased fluid accumulation in the injected joint. These effects all should resolve within a day after your procedure.  HOME CARE INSTRUCTIONS  Limit yourself to light activity the day of your procedure. Avoid lifting heavy objects, bending, stooping, or twisting.  Take prescription or over-the-counter pain medication as directed by your caregiver.  You may apply ice to your injection site to reduce pain and swelling the day of your procedure. Ice may be applied 3-4 times:  Put ice in a plastic bag.  Place a towel between your skin and the bag.  Leave the ice on for no longer than 15-20 minutes each time. SEEK IMMEDIATE MEDICAL CARE IF:   Pain and swelling get worse rather than better or extend beyond the injection site.  Numbness does not go away.  Blood or fluid continues to leak from the injection site.  You have chest pain.  You have swelling of your face or tongue.  You have trouble breathing or you become dizzy.  You develop a fever, chills, or severe tenderness at the injection site that last longer than 1 day. MAKE SURE YOU:  Understand these instructions.  Watch your condition.  Get help right away if you are not doing well or if you get worse. Document Released: 10/10/2010 Document Revised: 04/21/2011 Document Reviewed: 10/10/2010  [facility] Patient Information 2014 [place].  Impingement Syndrome, Rotator Cuff, Bursitis With Rehab Impingement syndrome is a condition that involves inflammation of the tendons of the rotator cuff and the subacromial bursa, that causes pain in the shoulder. The rotator cuff consists of four tendons and muscles that control much of the shoulder and upper arm function. The subacromial bursa is a fluid filled sac that helps reduce friction between the rotator cuff and one of the bones of the shoulder (acromion). Impingement syndrome is usually an overuse injury that causes swelling of the bursa (bursitis), swelling of the tendon (tendonitis), and/or a tear of the tendon (strain). Strains are classified into three categories. Grade 1 strains cause pain, but the tendon is not lengthened. Grade 2 strains include a lengthened ligament, due to the ligament being stretched or partially ruptured. With grade 2 strains there is still function, although the function may be decreased. Grade 3 strains include a complete tear of the tendon or muscle, and function is usually impaired.  SYMPTOMS   Pain around the shoulder, often at the outer portion of the upper arm.  Pain that gets worse with shoulder function, especially when reaching overhead or lifting.  Sometimes, aching when not using the  arm.  Pain that wakes you up at night.  Sometimes, tenderness, swelling, warmth, or redness over the affected area.  Loss of strength.  Limited motion of the shoulder, especially reaching behind the back (to the back pocket or to unhook bra) or across your body.  Crackling sound (crepitation) when moving the arm.  Biceps tendon pain and inflammation (in the front of the shoulder). Worse when bending the elbow or lifting. CAUSES  Impingement syndrome is often an overuse injury, in which chronic (repetitive) motions cause the tendons or bursa to become inflamed. A strain occurs when a force is paced on the tendon or muscle that is greater than it can withstand. Common mechanisms of injury include: Stress from sudden increase in duration, frequency, or intensity of training.  Direct hit (trauma) to the shoulder.  Aging, erosion of the tendon with normal use.  Bony bump on shoulder (acromial spur). RISK INCREASES WITH:  Contact sports (football, wrestling, boxing).  Throwing sports (baseball, tennis, volleyball).  Weightlifting and bodybuilding.  Heavy labor.  Previous injury to the rotator cuff, including impingement.  Poor shoulder strength and flexibility.  Failure to warm up properly before activity.  Inadequate protective equipment.  Old age.  Bony bump on shoulder (acromial spur). PREVENTION   Warm up and stretch properly before activity.  Allow for adequate recovery between workouts.  Maintain physical fitness:  Strength, flexibility, and endurance.  Cardiovascular fitness.  Learn and use proper exercise technique. PROGNOSIS  If treated properly, impingement syndrome usually goes away within 6 weeks. Sometimes surgery is required.  RELATED COMPLICATIONS   Longer healing time if not properly treated, or if not given enough time to heal.  Recurring symptoms, that result in a chronic condition.  Shoulder stiffness, frozen shoulder, or loss of  motion.  Rotator cuff tendon tear.  Recurring symptoms, especially if activity is resumed too soon, with overuse, with a direct blow, or when using poor technique. TREATMENT  Treatment first involves the use of ice and medicine, to reduce pain and inflammation. The use of strengthening and stretching exercises may help reduce pain with activity. These exercises may be performed at home or with a therapist. If non-surgical treatment is unsuccessful after more than 6 months, surgery may be advised. After surgery and rehabilitation, activity is usually possible in 3 months.  MEDICATION  If pain medicine is needed, nonsteroidal anti-inflammatory medicines (aspirin and ibuprofen), or other minor pain relievers (acetaminophen), are often advised.  Do not take pain medicine for 7 days before surgery.  Prescription pain relievers may be given, if your caregiver thinks they are needed. Use only as directed and only as much as you need.  Corticosteroid injections may be given by your caregiver. These injections should be reserved for the most serious cases, because they may only be given a certain number of times. HEAT AND COLD  Cold treatment (icing) should be applied for 10 to 15 minutes every 2 to 3 hours for inflammation and pain, and immediately after activity that aggravates your symptoms. Use ice packs or an ice massage.  Heat treatment may be used before performing stretching and strengthening activities prescribed by your caregiver, physical therapist, or athletic trainer. Use a heat pack or a warm water soak. SEEK MEDICAL CARE IF:   Symptoms  get worse or do not improve in 4 to 6 weeks, despite treatment.  New, unexplained symptoms develop. (Drugs used in treatment may produce side effects.) EXERCISES:   Do each exercise 15 times Hold the position for 2 sec Do each exercise once a day   RANGE OF MOTION (ROM) AND STRETCHING EXERCISES - Impingement Syndrome (Rotator Cuff  Tendinitis,  Bursitis) These exercises may help you when beginning to rehabilitate your injury. Your symptoms may go away with or without further involvement from your physician, physical therapist or athletic trainer. While completing these exercises, remember:   Restoring tissue flexibility helps normal motion to return to the joints. This allows healthier, less painful movement and activity.  An effective stretch should be held for at least 30 seconds.  A stretch should never be painful. You should only feel a gentle lengthening or release in the stretched tissue. STRETCH - Flexion, Standing  Stand with good posture. With an underhand grip on your right / left hand, and an overhand grip on the opposite hand, grasp a broomstick or cane so that your hands are a little more than shoulder width apart.  Keeping your right / left elbow straight and shoulder muscles relaxed, push the stick with your opposite hand, to raise your right / left arm in front of your body and then overhead. Raise your arm until you feel a stretch in your right / left shoulder, but before you have increased shoulder pain.  Try to avoid shrugging your right / left shoulder as your arm rises, by keeping your shoulder blade tucked down and toward your mid-back spine. Hold for __________ seconds.  Slowly return to the starting position. Repeat __________ times. Complete this exercise __________ times per day. STRETCH - Abduction, Supine  Lie on your back. With an underhand grip on your right / left hand and an overhand grip on the opposite hand, grasp a broomstick or cane so that your hands are a little more than shoulder width apart.  Keeping your right / left elbow straight and your shoulder muscles relaxed, push the stick with your opposite hand, to raise your right / left arm out to the side of your body and then overhead. Raise your arm until you feel a stretch in your right / left shoulder, but before you have increased shoulder  pain.  Try to avoid shrugging your right / left shoulder as your arm rises, by keeping your shoulder blade tucked down and toward your mid-back spine. Hold for __________ seconds.  Slowly return to the starting position. Repeat __________ times. Complete this exercise __________ times per day. ROM - Flexion, Active-Assisted  Lie on your back. You may bend your knees for comfort.  Grasp a broomstick or cane so your hands are about shoulder width apart. Your right / left hand should grip the end of the stick, so that your hand is positioned "thumbs-up," as if you were about to shake hands.  Using your healthy arm to lead, raise your right / left arm overhead, until you feel a gentle stretch in your shoulder. Hold for __________ seconds.  Use the stick to assist in returning your right / left arm to its starting position. Repeat __________ times. Complete this exercise __________ times per day.  ROM - Internal Rotation, Supine   Lie on your back on a firm surface. Place your right / left elbow about 60 degrees away from your side. Elevate your elbow with a folded towel, so that the elbow and shoulder  are the same height.  Using a broomstick or cane and your strong arm, pull your right / left hand toward your body until you feel a gentle stretch, but no increase in your shoulder pain. Keep your shoulder and elbow in place throughout the exercise.  Hold for __________ seconds. Slowly return to the starting position. Repeat __________ times. Complete this exercise __________ times per day. STRETCH - Internal Rotation  Place your right / left hand behind your back, palm up.  Throw a towel or belt over your opposite shoulder. Grasp the towel with your right / left hand.  While keeping an upright posture, gently pull up on the towel, until you feel a stretch in the front of your right / left shoulder.  Avoid shrugging your right / left shoulder as your arm rises, by keeping your shoulder blade  tucked down and toward your mid-back spine.  Hold for __________ seconds. Release the stretch, by lowering your healthy hand. Repeat __________ times. Complete this exercise __________ times per day. ROM - Internal Rotation   Using an underhand grip, grasp a stick behind your back with both hands.  While standing upright with good posture, slide the stick up your back until you feel a mild stretch in the front of your shoulder.  Hold for __________ seconds. Slowly return to your starting position. Repeat __________ times. Complete this exercise __________ times per day.  STRETCH - Posterior Shoulder Capsule   Stand or sit with good posture. Grasp your right / left elbow and draw it across your chest, keeping it at the same height as your shoulder.  Pull your elbow, so your upper arm comes in closer to your chest. Pull until you feel a gentle stretch in the back of your shoulder.  Hold for __________ seconds. Repeat __________ times. Complete this exercise __________ times per day. STRENGTHENING EXERCISES - Impingement Syndrome (Rotator Cuff Tendinitis, Bursitis) These exercises may help you when beginning to rehabilitate your injury. They may resolve your symptoms with or without further involvement from your physician, physical therapist or athletic trainer. While completing these exercises, remember:  Muscles can gain both the endurance and the strength needed for everyday activities through controlled exercises.  Complete these exercises as instructed by your physician, physical therapist or athletic trainer. Increase the resistance and repetitions only as guided.  You may experience muscle soreness or fatigue, but the pain or discomfort you are trying to eliminate should never worsen during these exercises. If this pain does get worse, stop and make sure you are following the directions exactly. If the pain is still present after adjustments, discontinue the exercise until you can  discuss the trouble with your clinician.  During your recovery, avoid activity or exercises which involve actions that place your injured hand or elbow above your head or behind your back or head. These positions stress the tissues which you are trying to heal. STRENGTH - Scapular Depression and Adduction   With good posture, sit on a firm chair. Support your arms in front of you, with pillows, arm rests, or on a table top. Have your elbows in line with the sides of your body.  Gently draw your shoulder blades down and toward your mid-back spine. Gradually increase the tension, without tensing the muscles along the top of your shoulders and the back of your neck.  Hold for __________ seconds. Slowly release the tension and relax your muscles completely before starting the next repetition.  After you have practiced this exercise,  remove the arm support and complete the exercise in standing as well as sitting position. Repeat __________ times. Complete this exercise __________ times per day.  STRENGTH - Shoulder Abductors, Isometric  With good posture, stand or sit about 4-6 inches from a wall, with your right / left side facing the wall.  Bend your right / left elbow. Gently press your right / left elbow into the wall. Increase the pressure gradually, until you are pressing as hard as you can, without shrugging your shoulder or increasing any shoulder discomfort.  Hold for __________ seconds.  Release the tension slowly. Relax your shoulder muscles completely before you begin the next repetition. Repeat __________ times. Complete this exercise __________ times per day.  STRENGTH - External Rotators, Isometric  Keep your right / left elbow at your side and bend it 90 degrees.  Step into a door frame so that the outside of your right / left wrist can press against the door frame without your upper arm leaving your side.  Gently press your right / left wrist into the door frame, as if you  were trying to swing the back of your hand away from your stomach. Gradually increase the tension, until you are pressing as hard as you can, without shrugging your shoulder or increasing any shoulder discomfort.  Hold for __________ seconds.  Release the tension slowly. Relax your shoulder muscles completely before you begin the next repetition. Repeat __________ times. Complete this exercise __________ times per day.  STRENGTH - Supraspinatus   Stand or sit with good posture. Grasp a __________ weight, or an exercise band or tubing, so that your hand is "thumbs-up," like you are shaking hands.  Slowly lift your right / left arm in a "V" away from your thigh, diagonally into the space between your side and straight ahead. Lift your hand to shoulder height or as far as you can, without increasing any shoulder pain. At first, many people do not lift their hands above shoulder height.  Avoid shrugging your right / left shoulder as your arm rises, by keeping your shoulder blade tucked down and toward your mid-back spine.  Hold for __________ seconds. Control the descent of your hand, as you slowly return to your starting position. Repeat __________ times. Complete this exercise __________ times per day.  STRENGTH - External Rotators  Secure a rubber exercise band or tubing to a fixed object (table, pole) so that it is at the same height as your right / left elbow when you are standing or sitting on a firm surface.  Stand or sit so that the secured exercise band is at your uninjured side.  Bend your right / left elbow 90 degrees. Place a folded towel or small pillow under your right / left arm, so that your elbow is a few inches away from your side.  Keeping the tension on the exercise band, pull it away from your body, as if pivoting on your elbow. Be sure to keep your body steady, so that the movement is coming only from your rotating shoulder.  Hold for __________ seconds. Release the  tension in a controlled manner, as you return to the starting position. Repeat __________ times. Complete this exercise __________ times per day.  STRENGTH - Internal Rotators   Secure a rubber exercise band or tubing to a fixed object (table, pole) so that it is at the same height as your right / left elbow when you are standing or sitting on a firm surface.  Stand or sit so that the secured exercise band is at your right / left side.  Bend your elbow 90 degrees. Place a folded towel or small pillow under your right / left arm so that your elbow is a few inches away from your side.  Keeping the tension on the exercise band, pull it across your body, toward your stomach. Be sure to keep your body steady, so that the movement is coming only from your rotating shoulder.  Hold for __________ seconds. Release the tension in a controlled manner, as you return to the starting position. Repeat __________ times. Complete this exercise __________ times per day.  STRENGTH - Scapular Protractors, Standing   Stand arms length away from a wall. Place your hands on the wall, keeping your elbows straight.  Begin by dropping your shoulder blades down and toward your mid-back spine.  To strengthen your protractors, keep your shoulder blades down, but slide them forward on your rib cage. It will feel as if you are lifting the back of your rib cage away from the wall. This is a subtle motion and can be challenging to complete. Ask your caregiver for further instruction, if you are not sure you are doing the exercise correctly.  Hold for __________ seconds. Slowly return to the starting position, resting the muscles completely before starting the next repetition. Repeat __________ times. Complete this exercise __________ times per day. STRENGTH - Scapular Protractors, Supine  Lie on your back on a firm surface. Extend your right / left arm straight into the air while holding a __________ weight in your  hand.  Keeping your head and back in place, lift your shoulder off the floor.  Hold for __________ seconds. Slowly return to the starting position, and allow your muscles to relax completely before starting the next repetition. Repeat __________ times. Complete this exercise __________ times per day. STRENGTH - Scapular Protractors, Quadruped  Get onto your hands and knees, with your shoulders directly over your hands (or as close as you can be, comfortably).  Keeping your elbows locked, lift the back of your rib cage up into your shoulder blades, so your mid-back rounds out. Keep your neck muscles relaxed.  Hold this position for __________ seconds. Slowly return to the starting position and allow your muscles to relax completely before starting the next repetition. Repeat __________ times. Complete this exercise __________ times per day.  STRENGTH - Scapular Retractors  Secure a rubber exercise band or tubing to a fixed object (table, pole), so that it is at the height of your shoulders when you are either standing, or sitting on a firm armless chair.  With a palm down grip, grasp an end of the band in each hand. Straighten your elbows and lift your hands straight in front of you, at shoulder height. Step back, away from the secured end of the band, until it becomes tense.  Squeezing your shoulder blades together, draw your elbows back toward your sides, as you bend them. Keep your upper arms lifted away from your body throughout the exercise.  Hold for __________ seconds. Slowly ease the tension on the band, as you reverse the directions and return to the starting position. Repeat __________ times. Complete this exercise __________ times per day. STRENGTH - Shoulder Extensors   Secure a rubber exercise band or tubing to a fixed object (table, pole) so that it is at the height of your shoulders when you are either standing, or sitting on a firm armless chair.  With a thumbs-up grip,  grasp an end of the band in each hand. Straighten your elbows and lift your hands straight in front of you, at shoulder height. Step back, away from the secured end of the band, until it becomes tense.  Squeezing your shoulder blades together, pull your hands down to the sides of your thighs. Do not allow your hands to go behind you.  Hold for __________ seconds. Slowly ease the tension on the band, as you reverse the directions and return to the starting position. Repeat __________ times. Complete this exercise __________ times per day.  STRENGTH - Scapular Retractors and External Rotators   Secure a rubber exercise band or tubing to a fixed object (table, pole) so that it is at the height as your shoulders, when you are either standing, or sitting on a firm armless chair.  With a palm down grip, grasp an end of the band in each hand. Bend your elbows 90 degrees and lift your elbows to shoulder height, at your sides. Step back, away from the secured end of the band, until it becomes tense.  Squeezing your shoulder blades together, rotate your shoulders so that your upper arms and elbows remain stationary, but your fists travel upward to head height.  Hold for __________ seconds. Slowly ease the tension on the band, as you reverse the directions and return to the starting position. Repeat __________ times. Complete this exercise __________ times per day.  STRENGTH - Scapular Retractors and External Rotators, Rowing   Secure a rubber exercise band or tubing to a fixed object (table, pole) so that it is at the height of your shoulders, when you are either standing, or sitting on a firm armless chair.  With a palm down grip, grasp an end of the band in each hand. Straighten your elbows and lift your hands straight in front of you, at shoulder height. Step back, away from the secured end of the band, until it becomes tense.  Step 1: Squeeze your shoulder blades together. Bending your elbows, draw  your hands to your chest, as if you are rowing a boat. At the end of this motion, your hands and elbow should be at shoulder height and your elbows should be out to your sides.  Step 2: Rotate your shoulders, to raise your hands above your head. Your forearms should be vertical and your upper arms should be horizontal.  Hold for __________ seconds. Slowly ease the tension on the band, as you reverse the directions and return to the starting position. Repeat __________ times. Complete this exercise __________ times per day.  STRENGTH - Scapular Depressors  Find a sturdy chair without wheels, such as a dining room chair.  Keeping your feet on the floor, and your hands on the chair arms, lift your bottom up from the seat, and lock your elbows.  Keeping your elbows straight, allow gravity to pull your body weight down. Your shoulders will rise toward your ears.  Raise your body against gravity by drawing your shoulder blades down your back, shortening the distance between your shoulders and ears. Although your feet should always maintain contact with the floor, your feet should progressively support less body weight, as you get stronger.  Hold for __________ seconds. In a controlled and slow manner, lower your body weight to begin the next repetition. Repeat __________ times. Complete this exercise __________ times per day.    This information is not intended to replace advice given to you by your health  care provider. Make sure you discuss any questions you have with your health care provider.   Document Released: 01/27/2005 Document Revised: 02/17/2014 Document Reviewed: 05/11/2008 Elsevier Interactive Patient Education [employer].

## 2015-05-01 NOTE — Progress Notes (Signed)
Patient ID: Kenneth Fritz, male   DOB: 11/10/1955, 60 y.o.   MRN: [id number]  Chief Complaint  Patient presents with  . Shoulder Pain    left shoulder pain    HPI Kenneth Fritz is a 60 y.o. male.   Shoulder Pain  The pain is present in the left shoulder. This is a new problem. The current episode started more than 1 month ago. There has been no history of extremity trauma. The problem occurs constantly. The problem has been gradually worsening. The quality of the pain is described as sharp. The pain is at a severity of 7/10. Associated symptoms include a limited range of motion and stiffness. Pertinent negatives include no fever, itching, joint locking, joint swelling, numbness or tingling. The symptoms are aggravated by activity, cold and lying down. He has tried nothing for the symptoms. Family history does not include gout or rheumatoid arthritis. There is no history of diabetes, gout, osteoarthritis or rheumatoid arthritis.     Review of Systems Review of Systems  Constitutional: Negative for fever.  Musculoskeletal: Positive for arthralgias and stiffness. Negative for gout.  Skin: Negative for itching.  Neurological: Negative for tingling and numbness.  All other systems reviewed and are negative.   Past Medical History  Diagnosis Date  . 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 Pernicious anemia     Knee arthroscopy for PVNS  Prostate surgery   Family History  Problem Relation Age of Onset  . Diabetes Mother     Social History Social History  Substance Use Topics  . Smoking status: Never Smoker   . Smokeless tobacco: None  . Alcohol Use: None    No Known Allergies  No current outpatient prescriptions on file.   No current facility-administered medications for this visit.    Physical Exam Physical Exam Blood pressure 136/82, pulse 73, height 5\' 7"  (1.702 m), weight 163 lb 3.2 oz (74.027 kg). Appearance, there are no abnormalities in terms of appearance the  patient was well-developed and well-nourished. The grooming and hygiene were normal.  Mental status orientation, there was normal alertness and orientation Mood pleasant Ambulatory status normal with no assistive devices  Examination of the LEFT shoulder reveals tenderness in the peri-acromial region AND ROTATOR INTERVAL  Range of motion flexion = 150 Range of motion external rotation = 45 Range of motion internal rotation = T12  The patient is stable in abduction external rotation  Rotator cuff strength internal and external rotation grade 5 Strength of the supraspinatus grade  5 /5, BUT PAINFUL   Positive impingement sign left shoulder  Skin warm dry and intact without laceration or ulceration or erythema Neurologic examination normal sensation Vascular examination normal pulses with warm extremity and normal capillary refill  The opposite extremity no tenderness in the rotator interval or peri-acromial region. Normal flexion extension external rotation and internal rotation to T7. Stable in abduction external rotation. Rotator cuff strength normal. 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  . Left shoulder pain   . Rotator cuff syndrome of left shoulder Yes      Plan   Meds ordered this encounter  Medications  . naproxen (NAPROSYN) 500 MG  tablet    Sig: Take 1 tablet (500 mg total) by mouth 2 (two) times daily with a meal.    Dispense:  60 tablet    Refill:  2   Procedure note the subacromial injection shoulder left   Verbal consent was obtained to inject the  Left   Shoulder  Timeout was completed to confirm the injection site is a subacromial space of the  left   shoulder  Medication used Depo-Medrol 40 mg and lidocaine 1% 3 cc  Anesthesia was provided by ethyl chloride  The injection was performed in the left  posterior subacromial space. After pinning the skin with alcohol and anesthetized the skin with ethyl chloride the subacromial space was injected using a 20-gauge needle. There were no complications  Sterile dressing was applied.   2 months return

## 2015-06-06 ENCOUNTER — Ambulatory Visit (INDEPENDENT_AMBULATORY_CARE_PROVIDER_SITE_OTHER): Payer: BLUE CROSS/BLUE SHIELD

## 2015-06-06 DIAGNOSIS — D649 Anemia, unspecified: Secondary | ICD-10-CM

## 2015-06-06 MED ORDER — CYANOCOBALAMIN 1000 MCG/ML IJ SOLN
1000.0000 ug | Freq: Once | INTRAMUSCULAR | Status: AC
  Administered 2015-06-06: 1000 ug via INTRAMUSCULAR

## 2015-07-02 ENCOUNTER — Ambulatory Visit (INDEPENDENT_AMBULATORY_CARE_PROVIDER_SITE_OTHER): Payer: BLUE CROSS/BLUE SHIELD | Admitting: Orthopedic Surgery

## 2015-07-02 ENCOUNTER — Encounter: Payer: Self-pay | Admitting: Orthopedic Surgery

## 2015-07-02 VITALS — BP 139/87 | Ht 67.0 in | Wt 161.0 lb

## 2015-07-02 DIAGNOSIS — M75102 Unspecified rotator cuff tear or rupture of left shoulder, not specified as traumatic: Secondary | ICD-10-CM

## 2015-07-02 MED ORDER — TRAMADOL-ACETAMINOPHEN 37.5-325 MG PO TABS: 1.0000 | ORAL_TABLET | Freq: Four times a day (QID) | ORAL | Status: DC | PRN

## 2015-07-02 NOTE — Patient Instructions (Signed)
MRI LEFT SHOULDER

## 2015-07-02 NOTE — Progress Notes (Signed)
Patient ID: Kenneth Fritz, male   DOB: [birth year], 60 y.o.   MRN: [id number]  Chief Complaint  Patient presents with  . Follow-up    2 MONTH FOLLOW UP LEFT SHOULDER PAIN    HPI - History of atraumatic onset left shoulder now treated with naproxen and injection still having pain  ROS denies numbness tingling or neck pain or stiffness in the cervical spine  Past Medical History  Diagnosis Date  . 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 Pernicious anemia     BP 139/87 mmHg  Ht 5\' 7"  (1.702 m)  Wt 161 lb (73.029 kg)  BMI 25.21 kg/m2  Physical Exam Physical Exam  Constitutional: The patient appears well-developed and well-nourished. No distress.  The patient is oriented to person, place, and time.  Psychiatric: The patient has a normal mood and affect.  Cardiovascular: Intact distal pulses.   Neurological: sensation is normal  Skin: Skin is warm and dry. No rash noted. The patient is not diaphoretic. No erythema. No pallor.    Ortho Exam  Mild weakness left rotator cuff to manual muscle testing involving supraspinatus tendon with 4 out of 5 strength external and internal rotators are 5 out of 5  ASSESSMENT AND PLAN   Patient is failed exercise program anti-inflammatories including naproxen and ibuprofen for greater than 6 weeks and he also had an injection with continued pain and weakness in his left shoulder  Recommend MRI left shoulder to evaluate for rotator cuff tear which would require surgical intervention

## 2015-07-04 ENCOUNTER — Telehealth: Payer: Self-pay | Admitting: *Deleted

## 2015-07-04 NOTE — Telephone Encounter (Signed)
Spoke with Freda Munro at [facility] and MRI for left shoulder did not meet medical criteria and needs a PEER TO PEER [phone] Reference # [id number]. Case closes tomorrow 07/05/15 at 4pm.

## 2015-07-10 NOTE — Telephone Encounter (Signed)
ATTEMPTED TO PRE-CERT AGAIN AND DENIED AGAIN, REQUESTS PEER TO PEER [phone], WILL NEED PATIENT'S NAME AND DATE OF BIRTH

## 2015-07-13 ENCOUNTER — Other Ambulatory Visit: Payer: Self-pay | Admitting: *Deleted

## 2015-07-13 DIAGNOSIS — M75102 Unspecified rotator cuff tear or rupture of left shoulder, not specified as traumatic: Secondary | ICD-10-CM

## 2015-07-13 MED ORDER — DICLOFENAC SODIUM 50 MG PO TBEC: 50.0000 mg | DELAYED_RELEASE_TABLET | Freq: Two times a day (BID) | ORAL | Status: DC

## 2015-07-13 MED ORDER — ACETAMINOPHEN-CODEINE #3 300-30 MG PO TABS: 1.0000 | ORAL_TABLET | Freq: Four times a day (QID) | ORAL | Status: DC | PRN

## 2015-07-13 NOTE — Telephone Encounter (Signed)
PATIENT AGREEABLE, ORDER SENT TO PT, PRESCRIPTIONS CALLED IN

## 2015-07-13 NOTE — Telephone Encounter (Signed)
Please send patient to physical therapy for 30 days and then scheduled for recheck  Start him on diclofenac 50 mg twice a day #60 and Tylenol with codeine No. 3 one every 6 when necessary pain #60 one refill

## 2015-07-13 NOTE — Telephone Encounter (Signed)
CALLED PATIENT TO RELAY, NO ANSWER, LEFT VM

## 2015-07-16 NOTE — Addendum Note (Signed)
Addended by: Baldomero Lamy B on: 07/16/2015 01:21 PM   Modules accepted: Orders

## 2015-07-20 ENCOUNTER — Ambulatory Visit (HOSPITAL_COMMUNITY): Payer: BLUE CROSS/BLUE SHIELD

## 2015-07-24 ENCOUNTER — Ambulatory Visit (INDEPENDENT_AMBULATORY_CARE_PROVIDER_SITE_OTHER): Payer: BLUE CROSS/BLUE SHIELD | Admitting: *Deleted

## 2015-07-24 DIAGNOSIS — D649 Anemia, unspecified: Secondary | ICD-10-CM | POA: Diagnosis not present

## 2015-07-24 MED ORDER — CYANOCOBALAMIN 1000 MCG/ML IJ SOLN
1000.0000 ug | Freq: Once | INTRAMUSCULAR | Status: AC
  Administered 2015-07-24: 1000 ug via INTRAMUSCULAR

## 2015-07-25 ENCOUNTER — Ambulatory Visit (HOSPITAL_COMMUNITY): Payer: BLUE CROSS/BLUE SHIELD | Admitting: Physical Therapy

## 2015-07-30 ENCOUNTER — Ambulatory Visit (HOSPITAL_COMMUNITY): Payer: BLUE CROSS/BLUE SHIELD | Attending: Orthopedic Surgery

## 2015-07-30 ENCOUNTER — Encounter (HOSPITAL_COMMUNITY): Payer: Self-pay

## 2015-07-30 DIAGNOSIS — M25512 Pain in left shoulder: Secondary | ICD-10-CM | POA: Diagnosis present

## 2015-07-30 DIAGNOSIS — R29898 Other symptoms and signs involving the musculoskeletal system: Secondary | ICD-10-CM | POA: Insufficient documentation

## 2015-07-30 DIAGNOSIS — M25612 Stiffness of left shoulder, not elsewhere classified: Secondary | ICD-10-CM | POA: Diagnosis present

## 2015-07-30 NOTE — Therapy (Addendum)
[facility] [place], [place], [address] Phone: [phone]   Fax:  [phone]  Occupational Therapy Evaluation  Patient Details  Name: Kenneth Fritz MRN: [id number] Date of Birth: 1955-09-12 Referring Provider: Arther Abbott, MD  Encounter Date: 07/30/2015      OT End of Session - 07/30/15 1726    Visit Number 1   Number of Visits 9   Date for OT Re-Evaluation 08/29/15   Authorization Type BCBS Blue Option PPO   Authorization Time Period Approves 80 combined with PT. 0 used this year.   Authorization - Visit Number 1   Authorization - Number of Visits 61   OT Start Time [id number]   OT Stop Time 1718   OT Time Calculation (min) 33 min   Activity Tolerance Patient tolerated treatment well   Behavior During Therapy WFL for tasks assessed/performed      Past Medical History  Diagnosis Date  . 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 Pernicious anemia     No past surgical history on file.  There were no vitals filed for this visit.      Subjective Assessment - 07/30/15 1646    Subjective  S: If I'm not moving it then it's not bad but if I'm reaching out away for something is really bad.   Pertinent History Patient is a 60 y/o male S/P left RTC syndrome which began a couple months ago with no known injury. Patient reports pain with reaching out to the side and overhead at times. Pt has difficulty drying his back with a towel. Insurance will not approve a MRI until patient is seen by therapy for 4 weeks. Dr. Aline Brochure has referred patient to occupational therapy for evaluation and treatment.    Special Tests FOTO score: 51/100   Patient Stated Goals TO decrease pain level in arm.   Currently in Pain? No/denies  Pt reports that pain is a 9/10 with reaching out to the side.           [facility] OT Assessment - 07/30/15 1642    Assessment   Diagnosis left rotator cuff syndrome   Referring Provider Arther Abbott, MD   Onset Date --  2+ months    Assessment --  4 week from start of therapy   Prior Therapy None   Precautions   Precautions None   Restrictions   Weight Bearing Restrictions No   Balance Screen   Has the patient fallen in the past 6 months No   Home  Environment   Family/patient expects to be discharged to: Private residence   Prior Function   Level of Independence Independent   Vocation Full time employment   Vocation Requirements 40 years of working at [employer]    ADL   ADL comments Pt reports pain with reaching out to side (ie. turning off the alarm clock), sometimes reaching overhead, drying his back, and hugging someone.    Mobility   Mobility Status Independent   Written Expression   Dominant Hand Right   Vision - History   Baseline Vision No visual deficits   ROM / Strength   AROM / PROM / Strength Strength;AROM;PROM   Palpation   Palpation comment Mod fascial restrictions in left upper arm, anterior deltoid, upper trapezius, and scapularis region.    AROM   Overall AROM Comments Assessed standing. IR/er abducted   AROM Assessment Site Shoulder   Right/Left Shoulder Left   Left  Shoulder Flexion 145 Degrees   Left Shoulder ABduction 130 Degrees   Left Shoulder Internal Rotation 55 Degrees   Left Shoulder External Rotation 50 Degrees   PROM   Overall PROM Comments Assessed supine. IR/er adducted.   PROM Assessment Site Shoulder   Right/Left Shoulder Left   Left Shoulder Flexion 150 Degrees   Left Shoulder ABduction 180 Degrees   Left Shoulder Internal Rotation 50 Degrees   Left Shoulder External Rotation 25 Degrees   Strength   Overall Strength Comments Assessed standing. IR/er adducted.    Strength Assessment Site Shoulder   Right/Left Shoulder Left   Left Shoulder Flexion 5/5   Left Shoulder ABduction 5/5   Left Shoulder Internal Rotation 4/5   Left Shoulder External Rotation 4/5                         OT Education - 07/30/15  1726    Education provided Yes   Education Details shoulder stretches   Person(s) Educated Patient   Methods Explanation;Demonstration;Verbal cues;Handout   Comprehension Returned demonstration;Verbalized understanding          OT Short Term Goals - 07/30/15 1730    OT SHORT TERM GOAL #1   Title Patient will be educated and independent with HEP to increase functional use of LUE during daily and work related tasks.    Time 4   Period Weeks   Status New   OT SHORT TERM GOAL #2   Title Patient will return to highest level of independence during daily and work related tasks.    Time 4   Period Weeks   Status New   OT SHORT TERM GOAL #3   Title Patient will decrease pain level to 5/10 or less in Left arm when reaching out to the side or overhead.   Time 4   Period Weeks   Status New   OT SHORT TERM GOAL #4   Title Patient will increase left shoulder strength and stability to a 5/5 to increase ability to complete work tasks with less pain.   Time 4   Period Weeks   Status New   OT SHORT TERM GOAL #5   Title Patient will decrease fascial restrictions to min amount or less to increase functional mobility of LUE.   Time 4   Period Weeks   Status New   Additional Short Term Goals   Additional Short Term Goals Yes   OT SHORT TERM GOAL #6   Title Patient will increse A/ROM to WNL to increase ability to dry back with towel.   Time 4   Period Weeks   Status New                  Plan - 07/30/15 1727    Clinical Impression Statement A: patient is a 60 y/o male S/P left RTC syndrome causing increased pain and fascial restrictions and decreased strength and ROM resulting in difficulty completing daily and work related tasks with left arm.    Rehab Potential Excellent   OT Frequency 2x / week   OT Duration 4 weeks   OT Treatment/Interventions Self-care/ADL training;Ultrasound;DME and/or AE instruction;Passive range of motion;Patient/family education;Cryotherapy;Electrical  Stimulation;Moist Heat;Therapeutic activities;Therapeutic exercises;Manual Therapy   Plan P: Patient will benefit from skilled OT services to increase functional use of LUE during daily and work related tasks. Treatment Plan: Myofascial release, manual stretching, AA/ROM, A/ROM, general shoulder and scapular strengthening and stability.    Consulted and Agree  with Plan of Care Patient      Patient will benefit from skilled therapeutic intervention in order to improve the following deficits and impairments:  Decreased strength, Pain, Increased fascial restricitons, Decreased range of motion  Visit Diagnosis: Other symptoms and signs involving the musculoskeletal system - Plan: Ot plan of care cert/re-cert  Pain in left shoulder - Plan: Ot plan of care cert/re-cert  Stiffness of left shoulder, not elsewhere classified - Plan: Ot plan of care cert/re-cert    Problem List Patient Active Problem List   Diagnosis Date Noted  . Pernicious anemia 04/18/2013  . Prostate cancer ([place]) 11/10/2012  . 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

## 2015-07-30 NOTE — Patient Instructions (Signed)
Doorway Stretch  Place each hand opposite each other on the doorway. (You can change where you feel the stretch by moving arms higher or lower.) Step through with one foot and bend front knee until a stretch is felt and hold. Step through with the opposite foot on the next rep. Hold for _10____ seconds. Repeat _2___times.     Scapular Retraction (Standing)   With arms at sides, pinch shoulder blades together. Repeat _10___ times per set. Do ___1_ sets per session. Do __2__ sessions per day.  http://orth.exer.us/944   Copyright  VHI. All rights reserved.   Internal Rotation Across Back  Grab the end of a towel with your affected side, palm facing backwards. Grab the towel with your unaffected side and pull your affected hand across your back until you feel a stretch in the front of your shoulder. If you feel pain, pull just to the pain, do not pull through the pain. Hold. Return your affected arm to your side. Try to keep your hand/arm close to your body during the entire movement.       Hold for 10 seconds. Repeat twice.     Posterior Capsule Stretch   Stand or sit, one arm across body so hand rests over opposite shoulder. Gently push on crossed elbow with other hand until stretch is felt in shoulder of crossed arm. Hold _10__ seconds.  Repeat _2__ times per session. Do _2-3__ sessions per day.   Wall Flexion  Using a towel, slide your arm up the wall until a stretch is felt in your shoulder . Hold for 10 seconds. Complete twice.

## 2015-08-08 ENCOUNTER — Ambulatory Visit (HOSPITAL_COMMUNITY): Payer: BLUE CROSS/BLUE SHIELD

## 2015-08-08 ENCOUNTER — Encounter (HOSPITAL_COMMUNITY): Payer: Self-pay

## 2015-08-08 DIAGNOSIS — M25612 Stiffness of left shoulder, not elsewhere classified: Secondary | ICD-10-CM

## 2015-08-08 DIAGNOSIS — R29898 Other symptoms and signs involving the musculoskeletal system: Secondary | ICD-10-CM | POA: Diagnosis not present

## 2015-08-08 NOTE — Therapy (Signed)
[facility] [place], [place], [address] Phone: [phone]   Fax:  [phone]  Occupational Therapy Treatment  Patient Details  Name: Kenneth Fritz MRN: [id number] Date of Birth: [birth year] Referring Provider: Arther Abbott, MD  Encounter Date: 08/08/2015      OT End of Session - 08/08/15 0927    Visit Number 2   Number of Visits 9   Date for OT Re-Evaluation 08/29/15   Authorization Type BCBS Blue Option PPO   Authorization Time Period Approves 80 combined with PT. 0 used this year.   Authorization - Visit Number 2   Authorization - Number of Visits 88   OT Start Time 0820   OT Stop Time 0900   OT Time Calculation (min) 40 min   Activity Tolerance Patient tolerated treatment well   Behavior During Therapy WFL for tasks assessed/performed      Past Medical History  Diagnosis Date  . 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 Pernicious anemia     No past surgical history on file.  There were no vitals filed for this visit.      Subjective Assessment - 08/08/15 0837    Subjective  S: If I'm not doing anything I feel fine.   Currently in Pain? No/denies            [facility] OT Assessment - 08/08/15 0837    Assessment   Diagnosis left rotator cuff syndrome   Precautions   Precautions None                  OT Treatments/Exercises (OP) - 08/08/15 0838    Exercises   Exercises Shoulder   Shoulder Exercises: Supine   Protraction PROM;10 reps;AROM;12 reps   Horizontal ABduction PROM;10 reps;AROM;12 reps   External Rotation PROM;10 reps;AROM;12 reps   Internal Rotation PROM;10 reps;AROM;12 reps   Flexion PROM;10 reps;AROM;12 reps   ABduction PROM;10 reps;AROM;12 reps   Shoulder Exercises: Prone   Other Prone Exercises [place] exercises; 12X   Other Prone Exercises scapular raises 12X   Shoulder Exercises: Standing   External Rotation Theraband;12 reps   Theraband Level (Shoulder External  Rotation) Level 2 (Red)   Internal Rotation Theraband;12 reps   Theraband Level (Shoulder Internal Rotation) Level 2 (Red)   Extension Theraband;12 reps   Theraband Level (Shoulder Extension) Level 3 (Green)   Row [employer] reps   Theraband Level (Shoulder Row) Level 3 (Green)   Retraction Theraband;12 reps   Theraband Level (Shoulder Retraction) Level 2 (Red)   Shoulder Exercises: ROM/Strengthening   X to V Arms 12X   Proximal Shoulder Strengthening, Seated 12X no rest breaks   Manual Therapy   Manual Therapy Myofascial release   Manual therapy comments Manual therapy completed prior to exercises.   Myofascial Release Myofascial release and manual stretching completed to left upper arm, trapezius, and scaplaris region to decrease fascial restrictions and increase joint mobility in a pain free zone.                 OT Education - 08/08/15 0930    Education provided Yes   Education Details Pt given OT evaluation handout; reviewed POC and goals. Recommended patient be more aware of his neck and shoulder posture when at home and at work. Work on bring shoulders back versus forward.   Person(s) Educated Patient   Methods Explanation;Demonstration;Verbal cues;Handout;Tactile cues   Comprehension Verbalized understanding;Returned demonstration  OT Short Term Goals - 08/08/15 0929    OT SHORT TERM GOAL #1   Title Patient will be educated and independent with HEP to increase functional use of LUE during daily and work related tasks.    Time 4   Period Weeks   Status On-going   OT SHORT TERM GOAL #2   Title Patient will return to highest level of independence during daily and work related tasks.    Time 4   Period Weeks   Status On-going   OT SHORT TERM GOAL #3   Title Patient will decrease pain level to 5/10 or less in Left arm when reaching out to the side or overhead.   Time 4   Period Weeks   Status On-going   OT SHORT TERM GOAL #4   Title Patient will  increase left shoulder strength and stability to a 5/5 to increase ability to complete work tasks with less pain.   Time 4   Period Weeks   Status On-going   OT SHORT TERM GOAL #5   Title Patient will decrease fascial restrictions to min amount or less to increase functional mobility of LUE.   Time 4   Period Weeks   Status On-going   OT SHORT TERM GOAL #6   Title Patient will increse A/ROM to WNL to increase ability to dry back with towel.   Time 4   Period Weeks   Status On-going                  Plan - 08/08/15 [id number]    Clinical Impression Statement A: Initiated myofascial release, manual stretching, and A/ROM exercises focusing on scapular and shoulder stability and overall good posture. VC for form amd technique; especially during theraband scapular exercises.    Plan P: Add UBE bike.       Patient will benefit from skilled therapeutic intervention in order to improve the following deficits and impairments:  Decreased strength, Pain, Increased fascial restricitons, Decreased range of motion  Visit Diagnosis: Other symptoms and signs involving the musculoskeletal system  Stiffness of left shoulder, not elsewhere classified    Problem List Patient Active Problem List   Diagnosis Date Noted  . Pernicious anemia 04/18/2013  . Prostate cancer ([place]) 11/10/2012  . 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

## 2015-08-09 ENCOUNTER — Ambulatory Visit: Payer: BLUE CROSS/BLUE SHIELD | Admitting: Orthopedic Surgery

## 2015-08-10 ENCOUNTER — Ambulatory Visit (HOSPITAL_COMMUNITY): Payer: BLUE CROSS/BLUE SHIELD

## 2015-08-10 ENCOUNTER — Encounter (HOSPITAL_COMMUNITY): Payer: Self-pay

## 2015-08-10 DIAGNOSIS — M25612 Stiffness of left shoulder, not elsewhere classified: Secondary | ICD-10-CM

## 2015-08-10 DIAGNOSIS — R29898 Other symptoms and signs involving the musculoskeletal system: Secondary | ICD-10-CM

## 2015-08-10 NOTE — Therapy (Signed)
[place], [place], [address] Phone: [phone]   Fax:  [phone]  Occupational Therapy Treatment  Patient Details  Name: Kenneth Fritz MRN: [id number] Date of Birth: 05-09-1955 Referring Provider: Arther Abbott, MD  Encounter Date: 08/10/2015      OT End of Session - 08/10/15 0856    Visit Number 3   Number of Visits 9   Date for OT Re-Evaluation 08/29/15   Authorization Type [employer] Blue Option PPO   Authorization Time Period Approves 80 combined with PT. 0 used this year.   Authorization - Visit Number 3   Authorization - Number of Visits 68   OT Start Time 0820   OT Stop Time 0900   OT Time Calculation (min) 40 min   Activity Tolerance Patient tolerated treatment well   Behavior During Therapy WFL for tasks assessed/performed      Past Medical History  Diagnosis Date  . Cancer ([place]) 11/11    Prostate  . Pernicious anemia     No past surgical history on file.  There were no vitals filed for this visit.      Subjective Assessment - 08/10/15 0834    Subjective  S: I was sore after Wednesday but I'm ok now.    Currently in Pain? No/denies            [facility] OT Assessment - 08/10/15 0836    Assessment   Diagnosis left rotator cuff syndrome   Precautions   Precautions None                  OT Treatments/Exercises (OP) - 08/10/15 0836    Exercises   Exercises Shoulder   Shoulder Exercises: Supine   Protraction PROM;10 reps;AROM;12 reps   Horizontal ABduction PROM;10 reps;AROM;12 reps   External Rotation PROM;10 reps;AROM;12 reps   Internal Rotation PROM;10 reps;AROM;12 reps   Flexion PROM;10 reps;AROM;12 reps   ABduction PROM;10 reps;AROM;12 reps   Shoulder Exercises: Prone   Other Prone Exercises Hugh[place] exercises; 12X   Other Prone Exercises scapular raises 12X   Shoulder Exercises: Standing   Horizontal ABduction Theraband;10 reps   Theraband Level (Shoulder  Horizontal ABduction) Level 2 (Red)   External Rotation Theraband;12 reps   Theraband Level (Shoulder External Rotation) Level 2 (Red)   Internal Rotation Theraband;12 reps   Theraband Level (Shoulder Internal Rotation) Level 2 (Red)   Extension Theraband;12 reps   Theraband Level (Shoulder Extension) Level 3 (Green)   Row [employer] reps   Theraband Level (Shoulder Row) Level 3 (Green)   Retraction Theraband;12 reps   Theraband Level (Shoulder Retraction) Level 2 (Red)   Shoulder Exercises: ROM/Strengthening   UBE (Upper Arm Bike) Level 1  2' forward 2' reverse   X to V Arms 12X   Proximal Shoulder Strengthening, Seated 12X no rest breaks   Ball on Wall 1' flexion 1' abduction green ball   Manual Therapy   Manual Therapy Myofascial release   Manual therapy comments Manual therapy completed prior to exercises.   Myofascial Release Myofascial release and manual stretching completed to left upper arm, trapezius, and scaplaris region to decrease fascial restrictions and increase joint mobility in a pain free zone.                   OT Short Term Goals - 08/08/15 0929    OT SHORT TERM GOAL #1   Title Patient will be educated and independent with HEP to increase functional  use of LUE during daily and work related tasks.    Time 4   Period Weeks   Status On-going   OT SHORT TERM GOAL #2   Title Patient will return to highest level of independence during daily and work related tasks.    Time 4   Period Weeks   Status On-going   OT SHORT TERM GOAL #3   Title Patient will decrease pain level to 5/10 or less in Left arm when reaching out to the side or overhead.   Time 4   Period Weeks   Status On-going   OT SHORT TERM GOAL #4   Title Patient will increase left shoulder strength and stability to a 5/5 to increase ability to complete work tasks with less pain.   Time 4   Period Weeks   Status On-going   OT SHORT TERM GOAL #5   Title Patient will decrease fascial  restrictions to min amount or less to increase functional mobility of LUE.   Time 4   Period Weeks   Status On-going   OT SHORT TERM GOAL #6   Title Patient will increse A/ROM to WNL to increase ability to dry back with towel.   Time 4   Period Weeks   Status On-going                  Plan - 08/10/15 0857    Clinical Impression Statement A: Continued with A/ROM exercises adding ball on the wall, UBE bike, and horizontal abduction with theraband. VC for form and technique during theraband exercises.    Plan P: Continue with scapuarl strengthening and stability exercises. Increase repetitions to 15 if able.      Patient will benefit from skilled therapeutic intervention in order to improve the following deficits and impairments:  Decreased strength, Pain, Increased fascial restricitons, Decreased range of motion  Visit Diagnosis: Other symptoms and signs involving the musculoskeletal system  Stiffness of left shoulder, not elsewhere classified    Problem List Patient Active Problem List   Diagnosis Date Noted  . Pernicious anemia 04/18/2013  . Prostate cancer ([place]) 11/10/2012  . 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

## 2015-08-13 ENCOUNTER — Ambulatory Visit (HOSPITAL_COMMUNITY): Payer: BLUE CROSS/BLUE SHIELD | Attending: Orthopedic Surgery | Admitting: Specialist

## 2015-08-13 DIAGNOSIS — M25612 Stiffness of left shoulder, not elsewhere classified: Secondary | ICD-10-CM | POA: Insufficient documentation

## 2015-08-13 DIAGNOSIS — M25512 Pain in left shoulder: Secondary | ICD-10-CM | POA: Insufficient documentation

## 2015-08-13 DIAGNOSIS — R29898 Other symptoms and signs involving the musculoskeletal system: Secondary | ICD-10-CM | POA: Insufficient documentation

## 2015-08-13 NOTE — Therapy (Signed)
[place], [place], [address] Phone: [phone]   Fax:  [phone]  Occupational Therapy Treatment  Patient Details  Name: Kenneth Fritz MRN: [id number] Date of Birth: 1955/04/07 Referring Provider: Arther Abbott, MD  Encounter Date: 08/13/2015      OT End of Session - 08/13/15 1209    Visit Number 4   Number of Visits 9   Date for OT Re-Evaluation 08/29/15   Authorization Type [employer] Blue Option PPO   Authorization Time Period Approves 80 combined with PT. 0 used this year.   Authorization - Visit Number 4   Authorization - Number of Visits 79   OT Start Time [id number]   OT Stop Time 1206   OT Time Calculation (min) 43 min   Activity Tolerance Patient tolerated treatment well   Behavior During Therapy WFL for tasks assessed/performed      Past Medical History  Diagnosis Date  . Cancer ([place]) 11/11    Prostate  . Pernicious anemia     No past surgical history on file.  There were no vitals filed for this visit.      Subjective Assessment - 08/13/15 1125    Subjective  S:  " it never changes.  all depends what I am doing. reaching out to turn off the alarm clock or putting on a shirt kills me."   Currently in Pain? Yes   Pain Score 2    Pain Location Shoulder   Pain Orientation Left   Pain Descriptors / Indicators Sharp   Pain Type Acute pain            [facility] OT Assessment - 08/13/15 0001    Assessment   Diagnosis left rotator cuff syndrome   Precautions   Precautions None                  OT Treatments/Exercises (OP) - 08/13/15 0001    Exercises   Exercises Shoulder   Shoulder Exercises: Supine   Protraction PROM;10 reps;AROM;12 reps   Horizontal ABduction PROM;10 reps;AROM;12 reps   External Rotation PROM;10 reps;AROM;12 reps   Internal Rotation PROM;10 reps;AROM;12 reps   Flexion PROM;10 reps;AROM;12 reps   ABduction PROM;10 reps;AROM;12 reps   Shoulder Exercises:  Standing   Protraction AROM;10 reps   Horizontal ABduction AROM;10 reps   Theraband Level (Shoulder Horizontal ABduction) Level 3 (Green)   External Rotation Theraband;15 reps;AROM;10 reps   Theraband Level (Shoulder External Rotation) Level 3 (Green)   Internal Rotation AROM;10 reps;Theraband;15 reps   Theraband Level (Shoulder Internal Rotation) Level 3 (Green)   Flexion AROM;10 reps   ABduction AROM;10 reps   Extension Theraband;15 reps   Theraband Level (Shoulder Extension) Level 3 (Green)   Row [employer] reps   Theraband Level (Shoulder Row) Level 3 (Green)   Retraction Theraband;15 reps   Theraband Level (Shoulder Retraction) Level 3 (Green)   Shoulder Exercises: ROM/Strengthening   UBE (Upper Arm Bike) level 2 3' forward and 3' backwards   Proximal Shoulder Strengthening, Supine 10 times without resting   Proximal Shoulder Strengthening, Seated 10 times without resting   Ball on Wall 1'flexion and 1' abdcuction    Manual Therapy   Manual Therapy Myofascial release   Manual therapy comments Manual therapy completed prior to exercises.   Myofascial Release Myofascial release and manual stretching completed to left upper arm, trapezius, and scaplaris region to decrease fascial restrictions and increase joint mobility in a pain free zone.  OT Short Term Goals - 08/08/15 0929    OT SHORT TERM GOAL #1   Title Patient will be educated and independent with HEP to increase functional use of LUE during daily and work related tasks.    Time 4   Period Weeks   Status On-going   OT SHORT TERM GOAL #2   Title Patient will return to highest level of independence during daily and work related tasks.    Time 4   Period Weeks   Status On-going   OT SHORT TERM GOAL #3   Title Patient will decrease pain level to 5/10 or less in Left arm when reaching out to the side or overhead.   Time 4   Period Weeks   Status On-going   OT SHORT TERM GOAL #4   Title  Patient will increase left shoulder strength and stability to a 5/5 to increase ability to complete work tasks with less pain.   Time 4   Period Weeks   Status On-going   OT SHORT TERM GOAL #5   Title Patient will decrease fascial restrictions to min amount or less to increase functional mobility of LUE.   Time 4   Period Weeks   Status On-going   OT SHORT TERM GOAL #6   Title Patient will increse A/ROM to WNL to increase ability to dry back with towel.   Time 4   Period Weeks   Status On-going                  Plan - 08/13/15 1211    Clinical Impression Statement A: increased resistance with ube. patient has good form and WFL A/ROM, occassional verbal cueing needed to depress shoulder bladed during A/ROM exercises.   Plan P: add sidelying A/ROM for improved scapular stability and decreased pain.      Patient will benefit from skilled therapeutic intervention in order to improve the following deficits and impairments:  Decreased strength, Pain, Increased fascial restricitons, Decreased range of motion  Visit Diagnosis: Other symptoms and signs involving the musculoskeletal system  Stiffness of left shoulder, not elsewhere classified  Pain in left shoulder    Problem List Patient Active Problem List   Diagnosis Date Noted  . Pernicious anemia 04/18/2013  . Prostate cancer ([place]) 11/10/2012  . 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

## 2015-08-15 ENCOUNTER — Encounter (HOSPITAL_COMMUNITY): Payer: Self-pay

## 2015-08-15 ENCOUNTER — Ambulatory Visit (HOSPITAL_COMMUNITY): Payer: BLUE CROSS/BLUE SHIELD

## 2015-08-15 DIAGNOSIS — R29898 Other symptoms and signs involving the musculoskeletal system: Secondary | ICD-10-CM

## 2015-08-15 DIAGNOSIS — M25512 Pain in left shoulder: Secondary | ICD-10-CM

## 2015-08-15 DIAGNOSIS — M25612 Stiffness of left shoulder, not elsewhere classified: Secondary | ICD-10-CM

## 2015-08-15 NOTE — Therapy (Signed)
[place], [place], [address] Phone: [phone]   Fax:  [phone]  Occupational Therapy Treatment  Patient Details  Name: Kenneth Fritz MRN: [id number] Date of Birth: 03-02-1955 Referring Provider: Arther Abbott, MD  Encounter Date: 08/15/2015      OT End of Session - 08/15/15 0858    Visit Number 5   Number of Visits 9   Date for OT Re-Evaluation 08/29/15   Authorization Type [employer] Blue Option PPO   Authorization Time Period Approves 80 combined with PT. 0 used this year.   Authorization - Visit Number 5   Authorization - Number of Visits 57   OT Start Time 0820   OT Stop Time 0900   OT Time Calculation (min) 40 min   Activity Tolerance Patient tolerated treatment well   Behavior During Therapy WFL for tasks assessed/performed      Past Medical History  Diagnosis Date  . Cancer ([place]) 11/11    Prostate  . Pernicious anemia     No past surgical history on file.  There were no vitals filed for this visit.      Subjective Assessment - 08/15/15 0834    Subjective  S: It only hurt this morning when I was getting dressed.    Currently in Pain? No/denies            [facility] OT Assessment - 08/15/15 0834    Assessment   Diagnosis left rotator cuff syndrome   Precautions   Precautions None                  OT Treatments/Exercises (OP) - 08/15/15 0835    Exercises   Exercises Shoulder   Shoulder Exercises: Supine   Protraction PROM;5 reps;AROM;15 reps   Horizontal ABduction PROM;5 reps;AROM;15 reps   External Rotation PROM;5 reps;AROM;15 reps   Internal Rotation PROM;5 reps;AROM;15 reps   Flexion PROM;5 reps;AROM;15 reps   ABduction PROM;5 reps;AROM;15 reps   Shoulder Exercises: Prone   Other Prone Exercises [place] exercises; 15X   Other Prone Exercises scapular raises 15X   Shoulder Exercises: Sidelying   External Rotation AROM;12 reps   Internal Rotation AROM;12 reps    Flexion AROM;12 reps   ABduction AROM;12 reps   Shoulder Exercises: Standing   Other Standing Exercises A T Y formation with red theraband; 12X   Shoulder Exercises: ROM/Strengthening   UBE (Upper Arm Bike) level 2 3' forward and 3' backwards   Proximal Shoulder Strengthening, Supine 15 times without resting   Ball on Wall 1'flexion and 1' abdcuction    Manual Therapy   Manual Therapy Myofascial release   Manual therapy comments Manual therapy completed prior to exercises.   Myofascial Release Myofascial release and manual stretching completed to left upper arm, trapezius, and scaplaris region to decrease fascial restrictions and increase joint mobility in a pain free zone.                 OT Education - 08/15/15 0857    Education provided Yes   Education Details Educated patient to place his left arm in his button down shirt first when getting dressed to decrease pain. Also demonstrated an alteranate way to dry back with towel to decrease pain.   Person(s) Educated Patient   Methods Explanation;Verbal cues;Demonstration   Comprehension Verbalized understanding          OT Short Term Goals - 08/08/15 0929    OT SHORT TERM GOAL #1  Title Patient will be educated and independent with HEP to increase functional use of LUE during daily and work related tasks.    Time 4   Period Weeks   Status On-going   OT SHORT TERM GOAL #2   Title Patient will return to highest level of independence during daily and work related tasks.    Time 4   Period Weeks   Status On-going   OT SHORT TERM GOAL #3   Title Patient will decrease pain level to 5/10 or less in Left arm when reaching out to the side or overhead.   Time 4   Period Weeks   Status On-going   OT SHORT TERM GOAL #4   Title Patient will increase left shoulder strength and stability to a 5/5 to increase ability to complete work tasks with less pain.   Time 4   Period Weeks   Status On-going   OT SHORT TERM GOAL #5    Title Patient will decrease fascial restrictions to min amount or less to increase functional mobility of LUE.   Time 4   Period Weeks   Status On-going   OT SHORT TERM GOAL #6   Title Patient will increse A/ROM to WNL to increase ability to dry back with towel.   Time 4   Period Weeks   Status On-going                  Plan - 08/15/15 [id number]    Clinical Impression Statement A: Added sidelying exercises for scapular stability. Patient reports that abduction hurt the worst. Also completed additional theraband exercises to increase scapular stability with VC for form and technique.   Plan P: Add 1# weight to strengthening as able if patient has proper form.      Patient will benefit from skilled therapeutic intervention in order to improve the following deficits and impairments:  Decreased strength, Pain, Increased fascial restricitons, Decreased range of motion  Visit Diagnosis: Other symptoms and signs involving the musculoskeletal system  Stiffness of left shoulder, not elsewhere classified  Pain in left shoulder    Problem List Patient Active Problem List   Diagnosis Date Noted  . Pernicious anemia 04/18/2013  . Prostate cancer ([place]) 11/10/2012  . 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

## 2015-08-22 ENCOUNTER — Ambulatory Visit (HOSPITAL_COMMUNITY): Payer: BLUE CROSS/BLUE SHIELD

## 2015-08-22 DIAGNOSIS — M25512 Pain in left shoulder: Secondary | ICD-10-CM

## 2015-08-22 DIAGNOSIS — R29898 Other symptoms and signs involving the musculoskeletal system: Secondary | ICD-10-CM

## 2015-08-22 DIAGNOSIS — M25612 Stiffness of left shoulder, not elsewhere classified: Secondary | ICD-10-CM

## 2015-08-22 NOTE — Therapy (Signed)
[place], [place], [address] Phone: [phone]   Fax:  [phone]  Occupational Therapy Treatment  Patient Details  Name: Kenneth Fritz MRN: [id number] Date of Birth: [birth year] Referring Provider: Arther Abbott, MD  Encounter Date: 08/22/2015      OT End of Session - 08/22/15 0858    Visit Number 6   Number of Visits 9   Date for OT Re-Evaluation 08/29/15   Authorization Type [employer] Blue Option PPO   Authorization Time Period Approves 80 combined with PT. 0 used this year.   Authorization - Visit Number 6   Authorization - Number of Visits 41   OT Start Time 0820   OT Stop Time 0900   OT Time Calculation (min) 40 min   Activity Tolerance Patient tolerated treatment well   Behavior During Therapy WFL for tasks assessed/performed      Past Medical History  Diagnosis Date  . Cancer ([place]) 11/11    Prostate  . Pernicious anemia     No past surgical history on file.  There were no vitals filed for this visit.      Subjective Assessment - 08/22/15 0841    Subjective  S: It's been hurting since I left here last time.    Currently in Pain? Yes   Pain Score 2    Pain Location Shoulder   Pain Orientation Left   Pain Descriptors / Indicators Sharp   Pain Type Acute pain   Pain Radiating Towards N/A   Pain Onset In the past 7 days   Pain Frequency Constant   Aggravating Factors  Abduction   Pain Relieving Factors ice, heat, and rest   Effect of Pain on Daily Activities None   Multiple Pain Sites No            [facility] OT Assessment - 08/22/15 0822    Assessment   Diagnosis left rotator cuff syndrome   Precautions   Precautions None                  OT Treatments/Exercises (OP) - 08/22/15 [id number]    Exercises   Exercises Shoulder   Shoulder Exercises: Supine   Protraction PROM;5 reps;AROM;15 reps   Horizontal ABduction PROM;5 reps;AROM;15 reps   External Rotation PROM;5  reps;AROM;15 reps   Internal Rotation PROM;5 reps;AROM;15 reps   Flexion PROM;5 reps;AROM;15 reps   ABduction PROM;5 reps;AROM;15 reps   Shoulder Exercises: Sidelying   External Rotation AROM;12 reps   Internal Rotation AROM;12 reps   Flexion AROM;12 reps   ABduction AROM;12 reps   ABduction Limitations Pain felt when reaching 50% range.   Shoulder Exercises: Standing   Protraction AROM;12 reps   Horizontal ABduction AROM;12 reps   External Rotation AROM;12 reps   Internal Rotation AROM;12 reps   Flexion AROM;12 reps   ABduction AROM;12 reps   Shoulder Exercises: ROM/Strengthening   UBE (Upper Arm Bike) Level 2 2' forward 2' reverse   Proximal Shoulder Strengthening, Supine 15 times without resting   Proximal Shoulder Strengthening, Seated 12X no rest breaks   Manual Therapy   Manual Therapy Myofascial release;Muscle Energy Technique   Manual therapy comments Manual therapy completed prior to exercises.   Myofascial Release Myofascial release and manual stretching completed to left upper arm, trapezius, and scaplaris region to decrease fascial restrictions and increase joint mobility in a pain free zone.    Muscle Energy Technique Muscle energy technique to left medial deltoid to relax tone  and muscle spasm and improve range of motion.                  OT Short Term Goals - 08/08/15 0929    OT SHORT TERM GOAL #1   Title Patient will be educated and independent with HEP to increase functional use of LUE during daily and work related tasks.    Time 4   Period Weeks   Status On-going   OT SHORT TERM GOAL #2   Title Patient will return to highest level of independence during daily and work related tasks.    Time 4   Period Weeks   Status On-going   OT SHORT TERM GOAL #3   Title Patient will decrease pain level to 5/10 or less in Left arm when reaching out to the side or overhead.   Time 4   Period Weeks   Status On-going   OT SHORT TERM GOAL #4   Title Patient will  increase left shoulder strength and stability to a 5/5 to increase ability to complete work tasks with less pain.   Time 4   Period Weeks   Status On-going   OT SHORT TERM GOAL #5   Title Patient will decrease fascial restrictions to min amount or less to increase functional mobility of LUE.   Time 4   Period Weeks   Status On-going   OT SHORT TERM GOAL #6   Title Patient will increse A/ROM to WNL to increase ability to dry back with towel.   Time 4   Period Weeks   Status On-going                  Plan - 08/22/15 [id number]    Clinical Impression Statement A: Reports hurting since he left. Patient was unable to pinpoint the exact movement that he completed that started the pain. Pt reports that when he is at work it aggrevates the pain level. Ice and Heat help a little to relieve the pain. Did not add weights this session and focused on pain management and staying within pain tolerance when completing exercises.    Plan P: add 1# weights supine if able to tolerate with proper form.      Patient will benefit from skilled therapeutic intervention in order to improve the following deficits and impairments:  Decreased strength, Pain, Increased fascial restricitons, Decreased range of motion  Visit Diagnosis: Other symptoms and signs involving the musculoskeletal system  Stiffness of left shoulder, not elsewhere classified  Pain in left shoulder    Problem List Patient Active Problem List   Diagnosis Date Noted  . Pernicious anemia 04/18/2013  . Prostate cancer ([place]) 11/10/2012  . 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

## 2015-08-24 ENCOUNTER — Ambulatory Visit (HOSPITAL_COMMUNITY): Payer: BLUE CROSS/BLUE SHIELD

## 2015-08-24 ENCOUNTER — Encounter (HOSPITAL_COMMUNITY): Payer: Self-pay

## 2015-08-24 DIAGNOSIS — M25612 Stiffness of left shoulder, not elsewhere classified: Secondary | ICD-10-CM

## 2015-08-24 DIAGNOSIS — R29898 Other symptoms and signs involving the musculoskeletal system: Secondary | ICD-10-CM

## 2015-08-24 NOTE — Therapy (Signed)
[place], [place], [address] Phone: [phone]   Fax:  [phone]  Occupational Therapy Treatment  Patient Details  Name: Kenneth Fritz MRN: [id number] Date of Birth: 02/12/1955 Referring Provider: Arther Abbott, MD  Encounter Date: 08/24/2015      OT End of Session - 08/24/15 0952    Visit Number 7   Number of Visits 9   Date for OT Re-Evaluation 08/29/15   Authorization Type [employer] Blue Option PPO   Authorization Time Period Approves 80 combined with PT. 0 used this year.   Authorization - Visit Number 7   Authorization - Number of Visits 77   OT Start Time 0815   OT Stop Time 0900   OT Time Calculation (min) 45 min   Activity Tolerance Patient tolerated treatment well   Behavior During Therapy WFL for tasks assessed/performed      Past Medical History  Diagnosis Date  . Cancer ([place]) 11/11    Prostate  . Pernicious anemia     No past surgical history on file.  There were no vitals filed for this visit.      Subjective Assessment - 08/24/15 0833    Subjective  S: It doesn't hurt right now.   Currently in Pain? No/denies            [facility] OT Assessment - 08/24/15 0834    Assessment   Diagnosis left rotator cuff syndrome   Precautions   Precautions None                  OT Treatments/Exercises (OP) - 08/24/15 0835    Exercises   Exercises Shoulder   Shoulder Exercises: Supine   Protraction PROM;5 reps;Strengthening;10 reps   Protraction Weight (lbs) 1   Horizontal ABduction PROM;5 reps;Strengthening;10 reps   Horizontal ABduction Weight (lbs) 1   External Rotation PROM;5 reps;Strengthening;10 reps   External Rotation Weight (lbs) 1   Internal Rotation PROM;5 reps;Strengthening;10 reps   Internal Rotation Weight (lbs) 1   Flexion PROM;5 reps;Strengthening;10 reps   Shoulder Flexion Weight (lbs) 1   ABduction PROM;5 reps;Strengthening;10 reps   Shoulder ABduction  Weight (lbs) 1   Shoulder Exercises: Sidelying   External Rotation AROM;12 reps   Internal Rotation AROM;12 reps   Flexion AROM;12 reps   ABduction AROM;12 reps   ABduction Limitations Pain felt when reaching 50% range.   Shoulder Exercises: Standing   Protraction AROM;15 reps   Horizontal ABduction AROM;15 reps   External Rotation AROM;15 reps   Internal Rotation AROM;15 reps   Flexion AROM;15 reps   ABduction AROM;15 reps   Extension Theraband;15 reps   Theraband Level (Shoulder Extension) Level 3 (Green)   Row [employer] reps   Theraband Level (Shoulder Row) Level 3 (Green)   Retraction Theraband;15 reps   Theraband Level (Shoulder Retraction) Level 3 (Green)   Shoulder Exercises: ROM/Strengthening   UBE (Upper Arm Bike) Level 2 2' forward 2' reverse   Proximal Shoulder Strengthening, Supine 10X with1#   Ball on Wall 1'flexion and 1' abdcuction    Other ROM/Strengthening Exercises Shoulder retraction with red theraband; 10X with VC for form and technique.   Manual Therapy   Manual Therapy Myofascial release;Muscle Energy Technique   Manual therapy comments Manual therapy completed prior to exercises.   Myofascial Release Myofascial release and manual stretching completed to left upper arm, trapezius, and scaplaris region to decrease fascial restrictions and increase joint mobility in a pain free zone.  Muscle Energy Technique Muscle energy technique to left medial deltoid to relax tone and muscle spasm and improve range of motion.                  OT Short Term Goals - 08/08/15 0929    OT SHORT TERM GOAL #1   Title Patient will be educated and independent with HEP to increase functional use of LUE during daily and work related tasks.    Time 4   Period Weeks   Status On-going   OT SHORT TERM GOAL #2   Title Patient will return to highest level of independence during daily and work related tasks.    Time 4   Period Weeks   Status On-going   OT SHORT TERM  GOAL #3   Title Patient will decrease pain level to 5/10 or less in Left arm when reaching out to the side or overhead.   Time 4   Period Weeks   Status On-going   OT SHORT TERM GOAL #4   Title Patient will increase left shoulder strength and stability to a 5/5 to increase ability to complete work tasks with less pain.   Time 4   Period Weeks   Status On-going   OT SHORT TERM GOAL #5   Title Patient will decrease fascial restrictions to min amount or less to increase functional mobility of LUE.   Time 4   Period Weeks   Status On-going   OT SHORT TERM GOAL #6   Title Patient will increse A/ROM to WNL to increase ability to dry back with towel.   Time 4   Period Weeks   Status On-going                  Plan - 08/24/15 [id number]    Clinical Impression Statement Added 1# weight to supine strengthening. Pain level of 4/10 felt during abduction. Added shoulder retraction against the wall with red theraband to increase shoulder/scapular stability. Patient continues to require min VC for form and technique.    Plan P: Continue with shoulder strengthening. Complete wall push ups.      Patient will benefit from skilled therapeutic intervention in order to improve the following deficits and impairments:  Decreased strength, Pain, Increased fascial restricitons, Decreased range of motion  Visit Diagnosis: Other symptoms and signs involving the musculoskeletal system  Stiffness of left shoulder, not elsewhere classified    Problem List Patient Active Problem List   Diagnosis Date Noted  . Pernicious anemia 04/18/2013  . Prostate cancer ([place]) 11/10/2012  . 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

## 2015-08-27 ENCOUNTER — Ambulatory Visit (HOSPITAL_COMMUNITY): Payer: BLUE CROSS/BLUE SHIELD

## 2015-08-27 DIAGNOSIS — M25612 Stiffness of left shoulder, not elsewhere classified: Secondary | ICD-10-CM

## 2015-08-27 DIAGNOSIS — R29898 Other symptoms and signs involving the musculoskeletal system: Secondary | ICD-10-CM | POA: Diagnosis not present

## 2015-08-27 NOTE — Therapy (Signed)
[name], [place], [address] Phone: [phone]   Fax:  [phone]  Occupational Therapy Treatment  Patient Details  Name: Kenneth Fritz MRN: [id number] Date of Birth: 04-20-1955 Referring Provider: Arther Abbott, MD  Encounter Date: 08/27/2015      OT End of Session - 08/27/15 1503    Visit Number 8   Number of Visits 9   Date for OT Re-Evaluation 08/29/15   Authorization Type [employer] Blue Option PPO   Authorization Time Period Approves 80 combined with PT. 0 used this year.   Authorization - Visit Number 8   Authorization - Number of Visits 44   OT Start Time [id number]   OT Stop Time 1515   OT Time Calculation (min) 42 min   Activity Tolerance Patient tolerated treatment well   Behavior During Therapy WFL for tasks assessed/performed      Past Medical History  Diagnosis Date  . Cancer ([place]) 11/11    Prostate  . Pernicious anemia     No past surgical history on file.  There were no vitals filed for this visit.      Subjective Assessment - 08/27/15 1511    Subjective  S: [place] I [place] something stupid. I lifted the tube up overhead and it hurt me.    Currently in Pain? No/denies            [facility] OT Assessment - 08/27/15 1454    Assessment   Diagnosis left rotator cuff syndrome   Precautions   Precautions None                  OT Treatments/Exercises (OP) - 08/27/15 1455    Exercises   Exercises Shoulder   Shoulder Exercises: Supine   Protraction PROM;5 reps;Strengthening;12 reps   Protraction Weight (lbs) 1   Horizontal ABduction PROM;5 reps;Strengthening;12 reps   Horizontal ABduction Weight (lbs) 1   External Rotation PROM;5 reps;Strengthening;12 reps   External Rotation Weight (lbs) 1   Internal Rotation PROM;5 reps;Strengthening;12 reps   Internal Rotation Weight (lbs) 1   Flexion PROM;5 reps;Strengthening;12 reps   Shoulder Flexion Weight  (lbs) 1   ABduction PROM;5 reps;Strengthening;12 reps   Shoulder ABduction Weight (lbs) 1   Shoulder Exercises: Standing   Protraction Strengthening;10 reps   Protraction Weight (lbs) 1   Horizontal ABduction Strengthening;10 reps   Horizontal ABduction Weight (lbs) 1   External Rotation Strengthening;10 reps   External Rotation Weight (lbs) 1   Internal Rotation Strengthening;10 reps   Internal Rotation Weight (lbs) 1   Flexion Strengthening;10 reps   Shoulder Flexion Weight (lbs) 1   ABduction Strengthening;10 reps   Shoulder ABduction Weight (lbs) 1   Shoulder Exercises: ROM/Strengthening   UBE (Upper Arm Bike) Level 2 2' forward 2' reverse   Wall Pushups 10 reps   X to V Arms 10X with 1#   Proximal Shoulder Strengthening, Supine 12X with1#   Proximal Shoulder Strengthening, Seated 10X with 1#   Ball on Wall 1'flexion and 1' abdcuction    Manual Therapy   Manual Therapy Myofascial release;Muscle Energy Technique   Manual therapy comments Manual therapy completed prior to exercises.   Myofascial Release Myofascial release and manual stretching completed to left upper arm, trapezius, and scaplaris region to decrease fascial restrictions and increase joint mobility in a pain free zone.    Muscle Energy Technique Muscle energy technique to left medial deltoid to relax tone and muscle  spasm and improve range of motion.                  OT Short Term Goals - 08/08/15 0929    OT SHORT TERM GOAL #1   Title Patient will be educated and independent with HEP to increase functional use of LUE during daily and work related tasks.    Time 4   Period Weeks   Status On-going   OT SHORT TERM GOAL #2   Title Patient will return to highest level of independence during daily and work related tasks.    Time 4   Period Weeks   Status On-going   OT SHORT TERM GOAL #3   Title Patient will decrease pain level to 5/10 or less in Left arm when reaching out to the side or overhead.    Time 4   Period Weeks   Status On-going   OT SHORT TERM GOAL #4   Title Patient will increase left shoulder strength and stability to a 5/5 to increase ability to complete work tasks with less pain.   Time 4   Period Weeks   Status On-going   OT SHORT TERM GOAL #5   Title Patient will decrease fascial restrictions to min amount or less to increase functional mobility of LUE.   Time 4   Period Weeks   Status On-going   OT SHORT TERM GOAL #6   Title Patient will increse A/ROM to WNL to increase ability to dry back with towel.   Time 4   Period Weeks   Status On-going                  Plan - 08/27/15 1512    Clinical Impression Statement A: Continued with strengthening and completed exercises standing with 1# hand weight. Patient able to increase supine strengthening exercises to 12 repetitions.    Plan P: Reassess for MD appointment. Add sidelying and cybex row and press.       Patient will benefit from skilled therapeutic intervention in order to improve the following deficits and impairments:  Decreased strength, Pain, Increased fascial restricitons, Decreased range of motion  Visit Diagnosis: Other symptoms and signs involving the musculoskeletal system  Stiffness of left shoulder, not elsewhere classified    Problem List Patient Active Problem List   Diagnosis Date Noted  . Pernicious anemia 04/18/2013  . Prostate cancer ([place]) 11/10/2012  . 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 [place], [place], [address] Phone: [phone]   Fax:  [phone]  Name: Kenneth Fritz MRN: [id number] Date of Birth: [birth year]

## 2015-08-31 ENCOUNTER — Telehealth (HOSPITAL_COMMUNITY): Payer: Self-pay

## 2015-08-31 ENCOUNTER — Ambulatory Visit (HOSPITAL_COMMUNITY): Payer: BLUE CROSS/BLUE SHIELD

## 2015-08-31 DIAGNOSIS — M25612 Stiffness of left shoulder, not elsewhere classified: Secondary | ICD-10-CM

## 2015-08-31 DIAGNOSIS — R29898 Other symptoms and signs involving the musculoskeletal system: Secondary | ICD-10-CM

## 2015-08-31 NOTE — Therapy (Signed)
[place], [place], [address] Phone: [phone]   Fax:  [phone]  Occupational Therapy Treatment And reassessment  Patient Details  Name: Kenneth Fritz MRN: [id number] Date of Birth: [birth year] Referring Provider: Arther Abbott, MD  Encounter Date: 08/31/2015      OT End of Session - 08/31/15 0848    Visit Number 9   Number of Visits 9   Date for OT Re-Evaluation --   Authorization Type [employer] Blue Option PPO   Authorization Time Period Approves 80 combined with PT. 0 used this year.   Authorization - Visit Number 9   Authorization - Number of Visits 34   OT Start Time 0815   OT Stop Time 0900   OT Time Calculation (min) 45 min   Activity Tolerance Patient tolerated treatment well   Behavior During Therapy WFL for tasks assessed/performed      Past Medical History  Diagnosis Date  . Cancer ([place]) 11/11    Prostate  . Pernicious anemia     No past surgical history on file.  There were no vitals filed for this visit.          [facility] OT Assessment - 08/31/15 0820    Assessment   Diagnosis left rotator cuff syndrome   Precautions   Precautions None   Palpation   Palpation comment Min fascial restrictions in left anterior deltoid region.   AROM   Overall AROM Comments Assessed standing. IR/er abducted   AROM Assessment Site Shoulder   Right/Left Shoulder Left   Left Shoulder Flexion 145 Degrees  previous: same   Left Shoulder ABduction 140 Degrees  previous: 130   Left Shoulder Internal Rotation 60 Degrees  previous: 55   Left Shoulder External Rotation 40 Degrees   PROM   Overall PROM Comments Assessed supine. IR/er abducted.   PROM Assessment Site Shoulder   Right/Left Shoulder Left   Left Shoulder Flexion 165 Degrees  previous: 150   Left Shoulder ABduction 180 Degrees  previous: same   Left Shoulder Internal Rotation 90 Degrees  previous: 50   Left Shoulder External  Rotation 30 Degrees  previous: 25   Strength   Overall Strength Comments Assessed standing. IR/er adducted.    Strength Assessment Site Shoulder   Right/Left Shoulder Left   Left Shoulder Flexion 5/5  previous: same   Left Shoulder ABduction 5/5  previous: same   Left Shoulder Internal Rotation 4+/5  previous: 4/5   Left Shoulder External Rotation 4+/5  previous: 4/5                  OT Treatments/Exercises (OP) - 08/31/15 0839    Exercises   Exercises Shoulder   Shoulder Exercises: Supine   Protraction PROM;5 reps   Horizontal ABduction PROM;5 reps   External Rotation PROM;5 reps   Internal Rotation PROM;5 reps   Flexion PROM;5 reps   ABduction PROM;5 reps   Shoulder Exercises: Prone   Other Prone Exercises [place] exercises; 15X   Shoulder Exercises: Sidelying   External Rotation Strengthening;12 reps   External Rotation Weight (lbs) 1   Internal Rotation Strengthening;12 reps   Internal Rotation Weight (lbs) 1   ABduction Strengthening;12 reps   ABduction Weight (lbs) 1   Shoulder Exercises: Standing   Extension Theraband;15 reps   Theraband Level (Shoulder Extension) Level 3 (Green)   Row [employer] reps   Theraband Level (Shoulder Row) Level 3 (Green)   Shoulder Exercises: ROM/Strengthening  Cybex Press 1.5 plate  97W   Cybex Row 1.5 plate  26V   Manual Therapy   Manual Therapy Myofascial release;Muscle Energy Technique   Manual therapy comments Manual therapy completed prior to exercises.   Myofascial Release Myofascial release and manual stretching completed to left upper arm, trapezius, and scaplaris region to decrease fascial restrictions and increase joint mobility in a pain free zone.    Muscle Energy Technique Muscle energy technique to left medial deltoid to relax tone and muscle spasm and improve range of motion.                  OT Short Term Goals - 08/31/15 0843    OT SHORT TERM GOAL #1   Title Patient will be educated  and independent with HEP to increase functional use of LUE during daily and work related tasks.    Time 4   Period Weeks   Status Achieved   OT SHORT TERM GOAL #2   Title Patient will return to highest level of independence during daily and work related tasks.    Time 4   Period Weeks   Status Not Met   OT SHORT TERM GOAL #3   Title Patient will decrease pain level to 5/10 or less in Left arm when reaching out to the side or overhead.   Time 4   Period Weeks   Status Not Met   OT SHORT TERM GOAL #4   Title Patient will increase left shoulder strength and stability to a 5/5 to increase ability to complete work tasks with less pain.   Time 4   Period Weeks   Status Partially Met   OT SHORT TERM GOAL #5   Title Patient will decrease fascial restrictions to min amount or less to increase functional mobility of LUE.   Time 4   Period Weeks   Status Achieved   OT SHORT TERM GOAL #6   Title Patient will increse A/ROM to WNL to increase ability to dry back with towel.   Time 4   Period Weeks   Status On-going                  Plan - 08/31/15 0919    Clinical Impression Statement A: Reassesment completed this date. patient has completed 4 weeks of therapy as planned before insurance will approve a MRI. Pt has reported that he does not notice any improvement since starting therapy. pt continues to experience increased pain when reaching out to the side, overhead, behind his back , and when hugging someone. Empty can test is negative today. Increased pain during Lift off test behind back. Pt has met 1 therapy goal. Pt is in agreement with discharge from therapy to follow up with MD next week.    Plan P; D/C from therapy and follow up with MD for next step of treatment.       Patient will benefit from skilled therapeutic intervention in order to improve the following deficits and impairments:  Decreased strength, Pain, Increased fascial restricitons, Decreased range of  motion  Visit Diagnosis: Other symptoms and signs involving the musculoskeletal system  Stiffness of left shoulder, not elsewhere classified    Problem List Patient Active Problem List   Diagnosis Date Noted  . Pernicious anemia 04/18/2013  . Prostate cancer ([place]) 11/10/2012  . 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 Plan: Patient agrees to discharge.  Patient goals were not met. Patient is being discharged due to lack of progress.  ?????       Ailene Ravel, OTR/L,CBIS  [phone]  08/31/2015, 9:24 AM  [place] [address] [place], [place], [address] Phone: [phone]   Fax:  [phone]  Name: WAQAS BRUHL MRN: [id number] Date of Birth: [birth year]

## 2015-09-03 ENCOUNTER — Encounter: Payer: Self-pay | Admitting: Orthopedic Surgery

## 2015-09-03 ENCOUNTER — Ambulatory Visit (INDEPENDENT_AMBULATORY_CARE_PROVIDER_SITE_OTHER): Payer: BLUE CROSS/BLUE SHIELD | Admitting: Orthopedic Surgery

## 2015-09-03 VITALS — BP 129/78 | Ht 68.0 in | Wt 163.0 lb

## 2015-09-03 DIAGNOSIS — M75102 Unspecified rotator cuff tear or rupture of left shoulder, not specified as traumatic: Secondary | ICD-10-CM | POA: Diagnosis not present

## 2015-09-03 NOTE — Patient Instructions (Signed)
WE WILL SCHEDULE MRI FOR YOU AND CALL YOU WITH [facility]

## 2015-09-03 NOTE — Progress Notes (Signed)
Chief Complaint  Patient presents with  . Follow-up    left shoulder pain   BP 129/78   Ht 5\' 8"  (1.727 m)   Wt 163 lb (73.9 kg)   BMI 24.19 kg/m    60 year old male H medical onset left shoulder pain treated with naproxen injection and now one month physical therapy has not improved.  He was sent for MRI about a month ago and he denied it because he hadn't gone to therapy. He's gone to therapy and now presents with continued pain anterolateral left shoulder with weakness  Recommend MRI again.

## 2015-09-10 ENCOUNTER — Telehealth: Payer: Self-pay | Admitting: Orthopedic Surgery

## 2015-09-10 NOTE — Telephone Encounter (Signed)
Msg left on voicemail regarding MRI for L Shoulder to be done @ [facility]. Order [id number]  09/04/15 - 10/03/15  [phone]

## 2015-09-26 ENCOUNTER — Ambulatory Visit (HOSPITAL_COMMUNITY)
Admission: RE | Admit: 2015-09-26 | Discharge: 2015-09-26 | Disposition: A | Discharge status: Home / Self Care | Payer: BLUE CROSS/BLUE SHIELD | Source: Ambulatory Visit | Attending: Orthopedic Surgery | Admitting: Orthopedic Surgery

## 2015-09-26 DIAGNOSIS — M75102 Unspecified rotator cuff tear or rupture of left shoulder, not specified as traumatic: Secondary | ICD-10-CM | POA: Diagnosis not present

## 2015-10-03 ENCOUNTER — Encounter: Payer: Self-pay | Admitting: Orthopedic Surgery

## 2015-10-03 ENCOUNTER — Ambulatory Visit (INDEPENDENT_AMBULATORY_CARE_PROVIDER_SITE_OTHER): Payer: BLUE CROSS/BLUE SHIELD | Admitting: Orthopedic Surgery

## 2015-10-03 ENCOUNTER — Ambulatory Visit (INDEPENDENT_AMBULATORY_CARE_PROVIDER_SITE_OTHER): Payer: BLUE CROSS/BLUE SHIELD

## 2015-10-03 VITALS — BP 139/80 | HR 69 | Ht 68.0 in | Wt 165.0 lb

## 2015-10-03 DIAGNOSIS — M75102 Unspecified rotator cuff tear or rupture of left shoulder, not specified as traumatic: Secondary | ICD-10-CM | POA: Diagnosis not present

## 2015-10-03 DIAGNOSIS — M542 Cervicalgia: Secondary | ICD-10-CM | POA: Diagnosis not present

## 2015-10-03 NOTE — Progress Notes (Signed)
Follow up   BP 139/80   Pulse 69   Ht 5\' 8"  (1.727 m)   Wt 165 lb (74.8 kg)   BMI 25.09 kg/m   Chief Complaint  Patient presents with  . Results    MRI LEFT SHOULDER   Review of systems he does not complain of numbness or tingling in the left hand  His rotator cuff strength is grade 5 over 5 in both shoulders but he does have impingement 120 of shoulder flexion  Shoulder remain stable. Skin is normal shoulder stable pulses are good lymph nodes are negative sensation is normal in the left arm  Imaging studies MRI shoulder  IMPRESSION: 1. 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

## 2015-10-03 NOTE — Patient Instructions (Signed)
Office will call him to set up surgery left shoulder

## 2015-10-11 ENCOUNTER — Ambulatory Visit (INDEPENDENT_AMBULATORY_CARE_PROVIDER_SITE_OTHER): Payer: BLUE CROSS/BLUE SHIELD | Admitting: *Deleted

## 2015-10-11 DIAGNOSIS — D510 Vitamin B12 deficiency anemia due to intrinsic factor deficiency: Secondary | ICD-10-CM | POA: Diagnosis not present

## 2015-10-11 MED ORDER — CYANOCOBALAMIN 1000 MCG/ML IJ SOLN
1000.0000 ug | Freq: Once | INTRAMUSCULAR | Status: AC
  Administered 2015-10-11: 1000 ug via INTRAMUSCULAR

## 2015-10-22 ENCOUNTER — Other Ambulatory Visit: Payer: Self-pay | Admitting: *Deleted

## 2015-10-23 ENCOUNTER — Telehealth: Payer: Self-pay | Admitting: Orthopedic Surgery

## 2015-10-23 NOTE — Telephone Encounter (Signed)
Patient called you back regarding the scheduling of his surgery.  He said the date that you left in the message will not work for him.  Please call him when you get a minute.  Thanks

## 2015-10-23 NOTE — Telephone Encounter (Signed)
Surgery date changed from 10/5 to 10/19

## 2015-11-13 ENCOUNTER — Other Ambulatory Visit (HOSPITAL_COMMUNITY): Payer: BLUE CROSS/BLUE SHIELD

## 2015-11-20 ENCOUNTER — Encounter: Payer: Self-pay | Admitting: *Deleted

## 2015-11-20 NOTE — Progress Notes (Unsigned)
[employer] DOES NOT REQUIRE PRE AUTHORIZATION FOR CPT [phone], [phone] SCHEDULED FOR 11/29/15  REFERENCE- ANTOINETTE H. 11/20/15

## 2015-11-23 NOTE — Patient Instructions (Signed)
Kenneth Fritz  11/23/2015     @PREFPERIOPPHARMACY @   Your procedure is scheduled on  11/29/2015   Report to [facility] at  900  A.M.  Call this number if you have problems the morning of surgery:  [phone]   Remember:  Do not eat food or drink liquids after midnight.  Take these medicines the morning of surgery with A SIP OF WATER  Tylenol #3, voltaren, ultracet.   Do not wear jewelry, make-up or nail polish.  Do not wear lotions, powders, or perfumes, or deoderant.  Do not shave 48 hours prior to surgery.  Men may shave face and neck.  Do not bring valuables to the hospital.  [facility] is not responsible for any belongings or valuables.  Contacts, dentures or bridgework may not be worn into surgery.  Leave your suitcase in the car.  After surgery it may be brought to your room.  For patients admitted to the hospital, discharge time will be determined by your treatment team.  Patients discharged the day of surgery will not be allowed to drive home.   Name and phone number of your driver:   family Special instructions:  none  Please read over the following fact sheets that you were given. Anesthesia Post-op Instructions and Care and Recovery After Surgery       Shoulder Arthroscopy Shoulder arthroscopy is a surgical technique used to evaluate and treat injuries involving the shoulder joint. In this technique, small cuts (incisions) are made in your shoulder. A small, telescope-like instrument with a lighted camera on one end (arthroscope) and surgical instruments are inserted through these incisions into your shoulder joint. This allows your health care provider to look directly into the joint and repair any damage at the same time.  LET [facility] CARE PROVIDER KNOW ABOUT:  Any allergies you have.  All medicines you are taking, including vitamins, herbs, eye drops, creams, and over-the-counter medicines.  Previous problems you or members of your  family have had with the use of anesthetics.  Any blood disorders you have.  Previous surgeries you have had.  Medical conditions you have. RISKS AND COMPLICATIONS Generally, this is a safe procedure. However, problems can occur and include:  Damage to nerves or blood vessels.  Excess bleeding.  Blood clots.  Infection. BEFORE THE PROCEDURE   Ask your health care provider about:  Changing or stopping your regular medicines. This is especially important if you are taking diabetes medicines or blood thinners.  Taking medicines such as aspirin and ibuprofen. These medicines can thin your blood. Do not take these medicines before your procedure if your health care provider asks you not to.  Do not eat or drink anything after midnight on the night before the procedure or as directed by your health care provider.  You may have an exam or testing. PROCEDURE   You may be given a medicine to make you sleep (general anesthetic) or a medicine to numb the shoulder area (local anesthetic).  Several small incisions will be made in your shoulder. Saline fluid will be put in through one of the incisions to expand the joint space so your health care provider can see the area more easily.  An arthroscope will be inserted into one of the incisions. The arthroscope sends an image to a TV screen so the health care provider can examine your shoulder joint.  During the procedure, your health care provider may find various  problems.  Tools may be inserted through the other incisions to repair any injuries found. In some cases, the procedure may change to an open surgery if problems are found that cannot be repaired with arthroscopy.  The incisions will then be closed with stitches or tape. AFTER THE PROCEDURE  You will be taken to a recovery area where your progress will be watched.    This information is not intended to replace advice given to you by your health care provider. Make sure you  discuss any questions you have with your health care provider.   Document Released: 08/24/2013 Document Reviewed: 08/24/2013 Elsevier Interactive Patient Education 2016 [place]. Shoulder Arthroscopy, Care After Refer to this sheet in the next few weeks. These instructions provide you with information on caring for yourself after your procedure. Your health care provider may also give you more specific instructions. Your treatment has been planned according to current medical practices, but problems sometimes occur. Call your health care provider if you have any problems or questions after your procedure.  WHAT TO EXPECT AFTER THE PROCEDURE After your procedure, it is typical to have the following:  Pain at the site of the surgical cuts (incisions).  Stiffness in your shoulder. This should gradually decrease over time. Your health care provider may recommend physical therapy to help improve this.  Nausea, vomiting, or constipation. These symptoms can result from taking pain medicine after surgery.  Clear or red drainage from the incision sites. This is normal for a few days after surgery.  Fatigue. HOME CARE INSTRUCTIONS  Take medicines only as directed by your health care provider.  Use a sling as directed.  There are many different ways to close and cover an incision, including stitches, skin glue, and adhesive strips. Follow your health care provider's instructions on:  Incision care.  Bandage (dressing) changes and removal.  Incision closure removal.  Apply ice to the injured area:  Put ice in a plastic bag.  Place a towel between your skin and the bag.  Leave the ice on for 20 minutes, 2-3 times a day.  If physical therapy and exercises are prescribed by your health care provider, do them as directed.  Keep all follow-up visits as directed by your health care provider. This is important. SEEK MEDICAL CARE IF: You have a fever. SEEK IMMEDIATE MEDICAL CARE  IF:  You have drainage, redness, swelling, or increasing pain at the incision site.  You notice a bad smell coming from the incision site or dressing.  Your incision site breaks open after your stitches or tape has been removed.   This information is not intended to replace advice given to you by your health care provider. Make sure you discuss any questions you have with your health care provider.   Document Released: 08/24/2013 Document Reviewed: 08/24/2013 Elsevier Interactive Patient Education 2016 [employer] PATIENT INSTRUCTIONS POST-ANESTHESIA  IMMEDIATELY FOLLOWING SURGERY:  Do not drive or operate machinery for the first twenty four hours after surgery.  Do not make any important decisions for twenty four hours after surgery or while taking narcotic pain medications or sedatives.  If you develop intractable nausea and vomiting or a severe headache please notify your doctor immediately.  FOLLOW-UP:  Please make an appointment with your surgeon as instructed. You do not need to follow up with anesthesia unless specifically instructed to do so.  WOUND CARE INSTRUCTIONS (if applicable):  Keep a dry clean dressing on the anesthesia/puncture wound site if there is drainage.  Once the wound has quit draining you may leave it open to air.  Generally you should leave the bandage intact for twenty four hours unless there is drainage.  If the epidural site drains for more than 36-48 hours please call the anesthesia department.  QUESTIONS?:  Please feel free to call your physician or the hospital operator if you have any questions, and they will be happy to assist you.

## 2015-11-26 ENCOUNTER — Encounter (HOSPITAL_COMMUNITY): Payer: Self-pay

## 2015-11-26 ENCOUNTER — Encounter (HOSPITAL_COMMUNITY)
Admission: RE | Admit: 2015-11-26 | Discharge: 2015-11-26 | Disposition: A | Discharge status: Home / Self Care | Payer: BLUE CROSS/BLUE SHIELD | Source: Ambulatory Visit | Attending: Orthopedic Surgery | Admitting: Orthopedic Surgery

## 2015-11-26 DIAGNOSIS — M7582 Other shoulder lesions, left shoulder: Secondary | ICD-10-CM | POA: Diagnosis present

## 2015-11-26 DIAGNOSIS — Z833 Family history of diabetes mellitus: Secondary | ICD-10-CM | POA: Diagnosis not present

## 2015-11-26 DIAGNOSIS — M7542 Impingement syndrome of left shoulder: Secondary | ICD-10-CM | POA: Diagnosis not present

## 2015-11-26 DIAGNOSIS — Z8546 Personal history of malignant neoplasm of prostate: Secondary | ICD-10-CM | POA: Diagnosis not present

## 2015-11-26 DIAGNOSIS — M7552 Bursitis of left shoulder: Secondary | ICD-10-CM | POA: Diagnosis not present

## 2015-11-26 DIAGNOSIS — D510 Vitamin B12 deficiency anemia due to intrinsic factor deficiency: Secondary | ICD-10-CM | POA: Diagnosis not present

## 2015-11-26 LAB — CBC WITH DIFFERENTIAL/PLATELET
BASOS ABS: 0 10*3/uL (ref 0.0–0.1)
Basophils Relative: 1 %
EOS PCT: 1 %
Eosinophils Absolute: 0.1 10*3/uL (ref 0.0–0.7)
HEMATOCRIT: 42.8 % (ref 39.0–52.0)
Hemoglobin: 14.9 g/dL (ref 13.0–17.0)
LYMPHS PCT: 25 %
Lymphs Abs: 1.4 10*3/uL (ref 0.7–4.0)
MCH: 30.4 pg (ref 26.0–34.0)
MCHC: 34.8 g/dL (ref 30.0–36.0)
MCV: 87.3 fL (ref 78.0–100.0)
MONO ABS: 0.4 10*3/uL (ref 0.1–1.0)
MONOS PCT: 7 %
Neutro Abs: 3.7 10*3/uL (ref 1.7–7.7)
Neutrophils Relative %: 66 %
PLATELETS: 258 10*3/uL (ref 150–400)
RBC: 4.9 MIL/uL (ref 4.22–5.81)
RDW: 12.7 % (ref 11.5–15.5)
WBC: 5.5 10*3/uL (ref 4.0–10.5)

## 2015-11-26 LAB — BASIC METABOLIC PANEL
ANION GAP: 6 (ref 5–15)
BUN: 15 mg/dL (ref 6–20)
CALCIUM: 9.2 mg/dL (ref 8.9–10.3)
CO2: 28 mmol/L (ref 22–32)
CREATININE: 1.07 mg/dL (ref 0.61–1.24)
Chloride: 103 mmol/L (ref 101–111)
GFR calc Af Amer: >60 mL/min (ref >60)
GLUCOSE: 137 mg/dL — AB (ref 65–99)
Potassium: 4.7 mmol/L (ref 3.5–5.1)
Sodium: 137 mmol/L (ref 135–145)

## 2015-11-26 NOTE — Pre-Procedure Instructions (Signed)
Patient given information to sign up for my chart at home. 

## 2015-11-28 NOTE — H&P (Signed)
Chief Complaint  Patient presents with  . Shoulder Pain      left shoulder pain      HPI Kenneth Fritz is a 60 y.o. male.   Shoulder Pain  The pain is present in the left shoulder. The current episode started more than Several months ago. There has been no history of extremity trauma. The problem occurs constantly. The problem has been gradually worsening. The quality of the pain is described as sharp. The pain is at a severity of 7/10. Associated symptoms include a limited range of motion and stiffness. Pertinent negatives include no fever, itching, joint locking, joint swelling, numbness or tingling. The symptoms are aggravated by activity, cold and lying down. He has tried nothing for the symptoms. Family history does not include gout or rheumatoid arthritis. There is no history of diabetes, gout, osteoarthritis or rheumatoid arthritis.    Kenneth Fritz had MRI which did not show a tear but showed significant focal tendinosis bursitis, type II acromion with normal acromioclavicular joint normal labrum . With persistent pain despite physical therapy and cortisone injection. He opted for surgical intervention for sub acromial decompression   Review of Systems Review of Systems  Constitutional: Negative for fever.  Musculoskeletal: Positive for arthralgias and stiffness. Negative for gout.  Skin: Negative for itching.  Neurological: Negative for tingling and numbness.  All other systems reviewed and are negative.          Past Medical History  Diagnosis Date  . 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 Pernicious anemia        Knee arthroscopy for PVNS  Prostate surgery         Family History  Problem Relation Age of Onset  . Diabetes Mother        Social History     Social History  Substance Use Topics  . Smoking status: Never Smoker   . Smokeless tobacco: None  . Alcohol Use: None      No Known Allergies   No current outpatient prescriptions on file.    No current  facility-administered medications for this visit.      Physical Exam Physical Exam Blood pressure 136/82, pulse 73, height 5\' 7"  (1.702 m), weight 163 lb 3.2 oz (74.027 kg). Appearance, there are no abnormalities in terms of appearance the patient was well-developed and well-nourished. The grooming and hygiene were normal.   Mental status orientation, there was normal alertness and orientation Mood pleasant Ambulatory status normal with no assistive devices   Examination of the LEFT shoulder reveals tenderness in the peri-acromial region AND ROTATOR INTERVAL  Range of motion flexion = 150 Range of motion external rotation = 45 Range of motion internal rotation = T12   The patient is stable in abduction external rotation   Rotator cuff strength internal and external rotation grade 5 Strength of the supraspinatus grade  5 /5, BUT PAINFUL    Positive impingement sign left shoulder   Skin warm dry and intact without laceration or ulceration or erythema Neurologic examination normal sensation Vascular examination normal pulses with warm extremity and normal capillary refill   The opposite extremity no tenderness in the rotator interval or peri-acromial region. Normal flexion extension external rotation and internal rotation to T7. Stable in abduction external rotation. Rotator cuff strength normal. 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

## 2015-11-29 ENCOUNTER — Ambulatory Visit (HOSPITAL_COMMUNITY)
Admission: RE | Admit: 2015-11-29 | Discharge: 2015-11-29 | Disposition: A | Discharge status: Home / Self Care | Payer: BLUE CROSS/BLUE SHIELD | Source: Ambulatory Visit | Attending: Orthopedic Surgery | Admitting: Orthopedic Surgery

## 2015-11-29 ENCOUNTER — Encounter (HOSPITAL_COMMUNITY)
Admission: RE | Disposition: A | Discharge status: Home / Self Care | Payer: Self-pay | Source: Ambulatory Visit | Attending: Orthopedic Surgery

## 2015-11-29 ENCOUNTER — Ambulatory Visit (HOSPITAL_COMMUNITY): Payer: BLUE CROSS/BLUE SHIELD | Admitting: Anesthesiology

## 2015-11-29 ENCOUNTER — Encounter (HOSPITAL_COMMUNITY): Payer: Self-pay | Admitting: *Deleted

## 2015-11-29 DIAGNOSIS — M7552 Bursitis of left shoulder: Secondary | ICD-10-CM | POA: Diagnosis not present

## 2015-11-29 DIAGNOSIS — M7542 Impingement syndrome of left shoulder: Secondary | ICD-10-CM | POA: Insufficient documentation

## 2015-11-29 DIAGNOSIS — D510 Vitamin B12 deficiency anemia due to intrinsic factor deficiency: Secondary | ICD-10-CM | POA: Insufficient documentation

## 2015-11-29 DIAGNOSIS — Z833 Family history of diabetes mellitus: Secondary | ICD-10-CM | POA: Insufficient documentation

## 2015-11-29 DIAGNOSIS — Z8546 Personal history of malignant neoplasm of prostate: Secondary | ICD-10-CM | POA: Insufficient documentation

## 2015-11-29 DIAGNOSIS — M778 Other enthesopathies, not elsewhere classified: Secondary | ICD-10-CM

## 2015-11-29 DIAGNOSIS — M7582 Other shoulder lesions, left shoulder: Secondary | ICD-10-CM | POA: Diagnosis not present

## 2015-11-29 HISTORY — PX: SHOULDER ARTHROSCOPY WITH SUBACROMIAL DECOMPRESSION: SHX5684

## 2015-11-29 SURGERY — SHOULDER ARTHROSCOPY WITH SUBACROMIAL DECOMPRESSION
Anesthesia: Regional | Site: Shoulder | Laterality: Left

## 2015-11-29 MED ORDER — HYDROCODONE-ACETAMINOPHEN 7.5-325 MG PO TABS
1.0000 | ORAL_TABLET | Freq: Once | ORAL | Status: AC
  Administered 2015-11-29: 1 via ORAL

## 2015-11-29 MED ORDER — ROCURONIUM 10MG/ML (10ML) SYRINGE FOR MEDFUSION PUMP - OPTIME
INTRAVENOUS | Status: DC | PRN
  Administered 2015-11-29: 5 mg via INTRAVENOUS
  Administered 2015-11-29: 15 mg via INTRAVENOUS

## 2015-11-29 MED ORDER — FENTANYL CITRATE (PF) 100 MCG/2ML IJ SOLN
INTRAMUSCULAR | Status: DC | PRN
  Administered 2015-11-29: 50 ug via INTRAVENOUS

## 2015-11-29 MED ORDER — BUPIVACAINE-EPINEPHRINE (PF) 0.5% -1:200000 IJ SOLN
INTRAMUSCULAR | Status: AC
  Filled 2015-11-29: qty 30

## 2015-11-29 MED ORDER — LIDOCAINE HCL (CARDIAC) 10 MG/ML IV SOLN
INTRAVENOUS | Status: DC | PRN
  Administered 2015-11-29: 50 mg via INTRAVENOUS

## 2015-11-29 MED ORDER — ONDANSETRON HCL 4 MG/2ML IJ SOLN
4.0000 mg | Freq: Four times a day (QID) | INTRAMUSCULAR | Status: DC | PRN
  Filled 2015-11-29: qty 2

## 2015-11-29 MED ORDER — ONDANSETRON HCL 4 MG/2ML IJ SOLN
4.0000 mg | Freq: Once | INTRAMUSCULAR | Status: AC
  Administered 2015-11-29: 4 mg via INTRAVENOUS

## 2015-11-29 MED ORDER — FENTANYL CITRATE (PF) 100 MCG/2ML IJ SOLN
50.0000 ug | Freq: Once | INTRAMUSCULAR | Status: AC
  Administered 2015-11-29: 50 ug via INTRAVENOUS

## 2015-11-29 MED ORDER — GLYCOPYRROLATE 0.2 MG/ML IJ SOLN
INTRAMUSCULAR | Status: DC | PRN
  Administered 2015-11-29: 0.4 mg via INTRAVENOUS

## 2015-11-29 MED ORDER — BUPIVACAINE-EPINEPHRINE 0.5% -1:200000 IJ SOLN
INTRAMUSCULAR | Status: DC | PRN
  Administered 2015-11-29: 60 mL

## 2015-11-29 MED ORDER — CEFAZOLIN SODIUM-DEXTROSE 2-4 GM/100ML-% IV SOLN
2.0000 g | INTRAVENOUS | Status: AC
  Administered 2015-11-29: 2 g via INTRAVENOUS

## 2015-11-29 MED ORDER — OXYCODONE HCL 5 MG PO TABS: 5.0000 mg | ORAL_TABLET | Freq: Once | ORAL | Status: DC | PRN

## 2015-11-29 MED ORDER — NEOSTIGMINE METHYLSULFATE 10 MG/10ML IV SOLN
INTRAVENOUS | Status: AC
  Filled 2015-11-29: qty 1

## 2015-11-29 MED ORDER — SODIUM CHLORIDE 0.9 % IR SOLN
Status: DC | PRN
  Administered 2015-11-29: 1000 mL

## 2015-11-29 MED ORDER — PHENYLEPHRINE 40 MCG/ML (10ML) SYRINGE FOR IV PUSH (FOR BLOOD PRESSURE SUPPORT)
PREFILLED_SYRINGE | INTRAVENOUS | Status: AC
  Filled 2015-11-29: qty 10

## 2015-11-29 MED ORDER — FENTANYL CITRATE (PF) 250 MCG/5ML IJ SOLN
INTRAMUSCULAR | Status: AC
  Filled 2015-11-29: qty 5

## 2015-11-29 MED ORDER — FENTANYL CITRATE (PF) 100 MCG/2ML IJ SOLN: 25.0000 ug | INTRAMUSCULAR | Status: DC | PRN

## 2015-11-29 MED ORDER — BUPIVACAINE-EPINEPHRINE (PF) 0.5% -1:200000 IJ SOLN
INTRAMUSCULAR | Status: AC
  Filled 2015-11-29: qty 60

## 2015-11-29 MED ORDER — SUCCINYLCHOLINE 20MG/ML (10ML) SYRINGE FOR MEDFUSION PUMP - OPTIME
INTRAMUSCULAR | Status: DC | PRN
  Administered 2015-11-29: 120 mg via INTRAVENOUS

## 2015-11-29 MED ORDER — LACTATED RINGERS IV SOLN
INTRAVENOUS | Status: DC
  Administered 2015-11-29: 1000 mL via INTRAVENOUS

## 2015-11-29 MED ORDER — EPINEPHRINE PF 1 MG/ML IJ SOLN
INTRAMUSCULAR | Status: AC
  Filled 2015-11-29: qty 10

## 2015-11-29 MED ORDER — CEFAZOLIN SODIUM-DEXTROSE 2-4 GM/100ML-% IV SOLN
INTRAVENOUS | Status: AC
  Filled 2015-11-29: qty 100

## 2015-11-29 MED ORDER — KETOROLAC TROMETHAMINE 30 MG/ML IJ SOLN
INTRAMUSCULAR | Status: AC
  Filled 2015-11-29: qty 1

## 2015-11-29 MED ORDER — EPHEDRINE SULFATE 50 MG/ML IJ SOLN
INTRAMUSCULAR | Status: DC | PRN
  Administered 2015-11-29 (×2): 5 mg via INTRAVENOUS
  Administered 2015-11-29: 10 mg via INTRAVENOUS

## 2015-11-29 MED ORDER — OXYCODONE HCL 5 MG/5ML PO SOLN
5.0000 mg | Freq: Once | ORAL | Status: DC | PRN
  Filled 2015-11-29: qty 5

## 2015-11-29 MED ORDER — KETOROLAC TROMETHAMINE 30 MG/ML IJ SOLN
30.0000 mg | Freq: Once | INTRAMUSCULAR | Status: AC
  Administered 2015-11-29: 30 mg via INTRAVENOUS

## 2015-11-29 MED ORDER — MIDAZOLAM HCL 2 MG/2ML IJ SOLN
INTRAMUSCULAR | Status: AC
  Filled 2015-11-29: qty 2

## 2015-11-29 MED ORDER — SODIUM CHLORIDE 0.9 % IR SOLN
Status: DC | PRN
  Administered 2015-11-29 (×5): 3000 mL

## 2015-11-29 MED ORDER — BUPIVACAINE-EPINEPHRINE (PF) 0.5% -1:200000 IJ SOLN
INTRAMUSCULAR | Status: DC | PRN
  Administered 2015-11-29: 30 mL via PERINEURAL

## 2015-11-29 MED ORDER — GLYCOPYRROLATE 0.2 MG/ML IJ SOLN
INTRAMUSCULAR | Status: AC
  Filled 2015-11-29: qty 2

## 2015-11-29 MED ORDER — PROPOFOL 10 MG/ML IV BOLUS
INTRAVENOUS | Status: AC
  Filled 2015-11-29: qty 20

## 2015-11-29 MED ORDER — PHENYLEPHRINE HCL 10 MG/ML IJ SOLN
INTRAMUSCULAR | Status: DC | PRN
  Administered 2015-11-29 (×5): 80 ug via INTRAVENOUS

## 2015-11-29 MED ORDER — HYDROCODONE-ACETAMINOPHEN 7.5-325 MG PO TABS
ORAL_TABLET | ORAL | Status: AC
  Filled 2015-11-29: qty 1

## 2015-11-29 MED ORDER — MIDAZOLAM HCL 2 MG/2ML IJ SOLN
1.0000 mg | Freq: Once | INTRAMUSCULAR | Status: AC
  Administered 2015-11-29: 1 mg via INTRAVENOUS

## 2015-11-29 MED ORDER — CHLORHEXIDINE GLUCONATE 4 % EX LIQD: 60.0000 mL | Freq: Once | CUTANEOUS | Status: DC

## 2015-11-29 MED ORDER — FENTANYL CITRATE (PF) 100 MCG/2ML IJ SOLN
INTRAMUSCULAR | Status: AC
  Filled 2015-11-29: qty 2

## 2015-11-29 MED ORDER — HYDROCODONE-ACETAMINOPHEN 10-325 MG PO TABS: 1.0000 | ORAL_TABLET | ORAL | 0 refills | Status: DC | PRN

## 2015-11-29 MED ORDER — NEOSTIGMINE METHYLSULFATE 10 MG/10ML IV SOLN
INTRAVENOUS | Status: DC | PRN
  Administered 2015-11-29: 2 mg via INTRAVENOUS

## 2015-11-29 SURGICAL SUPPLY — 88 items
BAG HAMPER (MISCELLANEOUS) ×2 IMPLANT
BIT DRILL 2.0MX128MM (BIT) IMPLANT
BLADE AGGRESSIVE PLUS 4.0 (BLADE) ×2 IMPLANT
BLADE AVERAGE 25X9 (BLADE) IMPLANT
BLADE HEX COATED 2.75 (ELECTRODE) ×2 IMPLANT
BLADE SURG 15 STRL LF DISP TIS (BLADE) ×1 IMPLANT
BLADE SURG 15 STRL SS (BLADE) ×1
BLADE SURG SZ10 CARB STEEL (BLADE) ×2 IMPLANT
BLADE SURG SZ11 CARB STEEL (BLADE) ×2 IMPLANT
BNDG COHESIVE 4X5 TAN STRL (GAUZE/BANDAGES/DRESSINGS) ×2 IMPLANT
BUR 5.0 BARRELL (BURR) IMPLANT
BUR BARRELL 4.0 (BURR) ×2 IMPLANT
BUR ROUND 5.0 (BURR) IMPLANT
CANNULA DRILOCK 5.0X75 (CANNULA) IMPLANT
CANNULA DRILOCK 6.5X75 (CANNULA) ×2 IMPLANT
CANNULA DRILOCK 8.0X75 (CANNULA) IMPLANT
CHLORAPREP W/TINT 26ML (MISCELLANEOUS) ×2 IMPLANT
CLOTH BEACON ORANGE TIMEOUT ST (SAFETY) ×2 IMPLANT
COVER LIGHT HANDLE STERIS (MISCELLANEOUS) ×4 IMPLANT
COVER PROBE W GEL 5X96 (DRAPES) ×2 IMPLANT
DECANTER SPIKE VIAL GLASS SM (MISCELLANEOUS) ×4 IMPLANT
DRAPE PROXIMA HALF (DRAPES) ×2 IMPLANT
DRAPE SHOULDER BEACH CHAIR (DRAPES) ×2 IMPLANT
DRAPE U-SHAPE 47X51 STRL (DRAPES) ×2 IMPLANT
DRESSING ALLEVYN BORDER 5X5 (GAUZE/BANDAGES/DRESSINGS) ×2 IMPLANT
DRSG MEPILEX BORDER 4X8 (GAUZE/BANDAGES/DRESSINGS) IMPLANT
ELECT REM PT RETURN 9FT ADLT (ELECTROSURGICAL) ×2
ELECTRODE REM PT RTRN 9FT ADLT (ELECTROSURGICAL) ×1 IMPLANT
FIBERSTICK 2 (SUTURE) IMPLANT
GAUZE SPONGE 4X4 16PLY XRAY LF (GAUZE/BANDAGES/DRESSINGS) ×2 IMPLANT
GLOVE BIOGEL PI IND STRL 7.0 (GLOVE) ×1 IMPLANT
GLOVE BIOGEL PI INDICATOR 7.0 (GLOVE) ×1
GLOVE SKINSENSE NS SZ8.0 LF (GLOVE) ×1
GLOVE SKINSENSE STRL SZ8.0 LF (GLOVE) ×1 IMPLANT
GLOVE SS N UNI LF 8.5 STRL (GLOVE) ×2 IMPLANT
GOWN STRL REUS W/TWL LRG LVL3 (GOWN DISPOSABLE) ×6 IMPLANT
GOWN STRL REUS W/TWL XL LVL3 (GOWN DISPOSABLE) ×2 IMPLANT
IMMOBILIZER SHOULDER LGE (ORTHOPEDIC SUPPLIES) IMPLANT
IMMOBILIZER SHOULDER MED (ORTHOPEDIC SUPPLIES) IMPLANT
INST SET MINOR BONE (KITS) ×2 IMPLANT
IV NS IRRIG 3000ML ARTHROMATIC (IV SOLUTION) ×10 IMPLANT
KIT BLADEGUARD II DBL (SET/KITS/TRAYS/PACK) ×2 IMPLANT
KIT POSITION SHOULDER SCHLEI (MISCELLANEOUS) ×2 IMPLANT
KIT ROOM TURNOVER APOR (KITS) ×2 IMPLANT
MANIFOLD NEPTUNE II (INSTRUMENTS) ×2 IMPLANT
MARKER SKIN DUAL TIP RULER LAB (MISCELLANEOUS) ×2 IMPLANT
NEEDLE HYPO 18GX1.5 BLUNT FILL (NEEDLE) ×2 IMPLANT
NEEDLE HYPO 21X1.5 SAFETY (NEEDLE) ×2 IMPLANT
NEEDLE MAYO 6 CRC TAPER PT (NEEDLE) IMPLANT
NEEDLE SCORPION (NEEDLE) IMPLANT
NEEDLE SPNL 18GX3.5 QUINCKE PK (NEEDLE) ×2 IMPLANT
NS IRRIG 1000ML POUR BTL (IV SOLUTION) ×2 IMPLANT
PACK BASIC III (CUSTOM PROCEDURE TRAY) ×1
PACK SRG BSC III STRL LF ECLPS (CUSTOM PROCEDURE TRAY) ×1 IMPLANT
PAD ABD 5X9 TENDERSORB (GAUZE/BANDAGES/DRESSINGS) IMPLANT
PAD ARMBOARD 7.5X6 YLW CONV (MISCELLANEOUS) ×2 IMPLANT
PASSER SUT CAPTURE FIRST (SUTURE) IMPLANT
PASSER SUT SWANSON 36MM LOOP (INSTRUMENTS) ×2 IMPLANT
PENCIL HANDSWITCHING (ELECTRODE) ×2 IMPLANT
RASP SM TEAR CROSS CUT (RASP) IMPLANT
SET ARTHROSCOPY INST (INSTRUMENTS) ×2 IMPLANT
SET BASIN LINEN APH (SET/KITS/TRAYS/PACK) ×2 IMPLANT
SLING ARM FOAM STRAP LRG (SOFTGOODS) ×2 IMPLANT
STOCKINETTE IMPERVIOUS LG (DRAPES) ×2 IMPLANT
SUT BONE WAX W31G (SUTURE) IMPLANT
SUT ETHIBOND NAB OS 4 #2 30IN (SUTURE) IMPLANT
SUT ETHILON 3 0 FSL (SUTURE) ×2 IMPLANT
SUT FIBERWIRE #2 38 REV NDL BL (SUTURE)
SUT FIBERWIRE #2 38 T-5 BLUE (SUTURE)
SUT LASSO 45 DEGREE (SUTURE) IMPLANT
SUT LASSO 45 DEGREE LEFT (SUTURE) IMPLANT
SUT LASSO 45D RIGHT (SUTURE) IMPLANT
SUT MNCRL 0 VIOLET CTX 36 (SUTURE) ×1 IMPLANT
SUT MON AB 2-0 CT1 36 (SUTURE) IMPLANT
SUT MONOCRYL 0 CTX 36 (SUTURE) ×1
SUT PROLENE 2 0 FS (SUTURE) IMPLANT
SUT VIC AB 1 CT1 27 (SUTURE)
SUT VIC AB 1 CT1 27XBRD ANTBC (SUTURE) IMPLANT
SUTURE FIBERWR #2 38 T-5 BLUE (SUTURE) IMPLANT
SUTURE FIBERWR#2 38 REV NDL BL (SUTURE) IMPLANT
SYR 30ML LL (SYRINGE) ×2 IMPLANT
SYR 5ML LL (SYRINGE) ×2 IMPLANT
SYR BULB IRRIGATION 50ML (SYRINGE) IMPLANT
TOWEL OR 17X26 4PK STRL BLUE (TOWEL DISPOSABLE) ×2 IMPLANT
TUBING ARTHROSCOPY INFLOW/OUT (IRRIGATION / IRRIGATOR) ×2 IMPLANT
WAND 90 DEG TURBOVAC W/CORD (SURGICAL WAND) ×2 IMPLANT
YANKAUER SUCT 12FT TUBE ARGYLE (SUCTIONS) ×4 IMPLANT
YANKAUER SUCT BULB TIP 10FT TU (MISCELLANEOUS) ×4 IMPLANT

## 2015-11-29 NOTE — Anesthesia Postprocedure Evaluation (Signed)
Anesthesia Post Note  Patient: Kenneth Fritz  Procedure(s) Performed: Procedure(s) (LRB): SHOULDER ARTHROSCOPY WITH SUBACROMIAL DECOMPRESSION (Left)  Patient location during evaluation: PACU Anesthesia Type: General and Regional Level of consciousness: awake and alert and patient cooperative Pain management: pain level controlled Vital Signs Assessment: post-procedure vital signs reviewed and stable Respiratory status: spontaneous breathing and respiratory function stable Cardiovascular status: stable Anesthetic complications: no    Last Vitals:  Vitals:   11/29/15 1315 11/29/15 1322  BP: 128/79 135/75  Pulse: 67 65  Resp: 16 16  Temp:  36.4 C    Last Pain:  Vitals:   11/29/15 1322  TempSrc: Oral  PainSc:                  [place]

## 2015-11-29 NOTE — Op Note (Addendum)
11/29/2015  12:21 PM  PATIENT:  Kenneth Fritz  60 y.o. male  PRE-OPERATIVE DIAGNOSIS:  LEFT ROTATOR CUFF TENDONITIS  POST-OPERATIVE DIAGNOSIS:  LEFT ROTATOR CUFF TENDONITIS  PROCEDURE:  Procedure(s) with comments: SHOULDER ARTHROSCOPY WITH EXTENSIVE DEBRIDEMENT AND SUBACROMIAL DECOMPRESSION (Left)  SURGEON:  Surgeon(s) and Role:    * Carole Civil, MD - Primary  PHYSICIAN ASSISTANT:   ASSISTANTS: Simonne Maffucci   ANESTHESIA:   general  EBL:  No intake/output data recorded.  BLOOD ADMINISTERED:none  DRAINS: none   LOCAL MEDICATIONS USED:  MARCAINE     SPECIMEN:  No Specimen  DISPOSITION OF SPECIMEN:  N/A  COUNTS:  YES  TOURNIQUET:  * No tourniquets in log *  DICTATION: .Dragon Dictation  PLAN OF CARE: Discharge to home after PACU  PATIENT DISPOSITION:  PACU - hemodynamically stable.   Delay start of Pharmacological VTE agent (>24hrs) due to surgical blood loss or risk of bleeding: not applicable  Surgical findings normal rotator cuff biceps tendon biceps tendon anchor glenoid and humerus, mild bursitis in the subacromial space with large anterior acromial projection  Acromioclavicular joint normal  Procedure was done as follows Kenneth Fritz was identified in the preop area and the surgical site was confirmed and marked as left shoulder. Appropriate chart review was completed and patient was taken to the operating room where he had general anesthesia was placed in the modified beachchair position with a Schlein positioner.  Sterile prep and drape was performed  Timeout was completed  A posterior portal was established the scope was placed into the joint diagnostic arthroscopy was performed there was no intra-articular pathology  The scope was then placed in the subacromial space bursitis was noted. A lateral portal was established bursitis was resected using a synovial shaver  We then taken ArthroCare wand 90 and outlined the acromion. We identified  the before meals joint. We removed the anterior portion of the acromion.  The acromion was checked for flatness and smoothed appropriately  The subacromial space was irrigated and injected with 60 mL of Marcaine with epinephrine  2 portals were closed with 3-0 nylon suture  The patient was placed in a sling  After extubation was taken recovery room in excellent condition  Plan 3-5 days of rest and then Codman exercises for 2 weeks and then progressive resistance exercises for 6 weeks.

## 2015-11-29 NOTE — Anesthesia Procedure Notes (Signed)
Procedure Name: Intubation Performed by: Ollen Bowl Pre-anesthesia Checklist: Patient identified, Emergency Drugs available, Suction available, Patient being monitored and Timeout performed Patient Re-evaluated:Patient Re-evaluated prior to inductionOxygen Delivery Method: Circle system utilized Preoxygenation: Pre-oxygenation with 100% oxygen Intubation Type: IV induction Ventilation: Mask ventilation without difficulty Laryngoscope Size: Glidescope and 3 Grade View: Grade II Tube type: Oral Tube size: 7.0 mm Number of attempts: 1 Airway Equipment and Method: Stylet Placement Confirmation: ETT inserted through vocal cords under direct vision,  positive ETCO2 and breath sounds checked- equal and bilateral Secured at: 22 cm Tube secured with: Tape Dental Injury: Teeth and Oropharynx as per pre-operative assessment

## 2015-11-29 NOTE — Brief Op Note (Signed)
11/29/2015  12:21 PM  PATIENT:  Kenneth Fritz  60 y.o. male  PRE-OPERATIVE DIAGNOSIS:  LEFT ROTATOR CUFF TENDONITIS  POST-OPERATIVE DIAGNOSIS:  LEFT ROTATOR CUFF TENDONITIS  PROCEDURE:  Procedure(s) with comments: SHOULDER ARTHROSCOPY WITH EXTENSIVE DEBRIDEMENT AND SUBACROMIAL DECOMPRESSION (Left)  SURGEON:  Surgeon(s) and Role:    * Carole Civil, MD - Primary  PHYSICIAN ASSISTANT:   ASSISTANTS: Simonne Maffucci   ANESTHESIA:   general  EBL:  No intake/output data recorded.  BLOOD ADMINISTERED:none  DRAINS: none   LOCAL MEDICATIONS USED:  MARCAINE     SPECIMEN:  No Specimen  DISPOSITION OF SPECIMEN:  N/A  COUNTS:  YES  TOURNIQUET:  * No tourniquets in log *  DICTATION: .Dragon Dictation  PLAN OF CARE: Discharge to home after PACU  PATIENT DISPOSITION:  PACU - hemodynamically stable.   Delay start of Pharmacological VTE agent (>24hrs) due to surgical blood loss or risk of bleeding: not applicable  Surgical findings normal rotator cuff biceps tendon biceps tendon anchor glenoid and humerus, mild bursitis in the subacromial space with large anterior acromial projection  Before meals joint normal  Procedure was done as follows Kenneth Fritz was identified in the preop area and the surgical site was confirmed and marked as left shoulder. Appropriate chart review was completed and patient was taken to the operating room where he had general anesthesia was placed in the modified beachchair position with a Schlein positioner.  Sterile prep and drape was performed  Timeout was completed  A posterior portal was established the scope was placed into the joint diagnostic arthroscopy was performed there was no intra-articular pathology  The scope was then placed in the subacromial space bursitis was noted. A lateral portal was established bursitis was resected using a synovial shaver  We then taken ArthroCare wand 90 and outlined the acromion. We identified the  before meals joint. We removed the anterior portion of the acromion.  The acromion was checked for flatness and smoothed appropriately  The subacromial space was irrigated and injected with 60 mL of Marcaine with epinephrine  2 portals were closed with 3-0 nylon suture  The patient was placed in a sling  After extubation was taken recovery room in excellent condition  Plan 3-5 days of rest and then Codman exercises for 2 weeks and then progressive resistance exercises for 6 weeks.

## 2015-11-29 NOTE — Anesthesia Procedure Notes (Signed)
Anesthesia Regional Block:  Interscalene brachial plexus block  Pre-Anesthetic Checklist: ,, timeout performed, Correct Patient, Correct Site, Correct Laterality, Correct Procedure, Correct Position, site marked, Risks and benefits discussed,  Surgical consent,  Pre-op evaluation,  At surgeon's request and post-op pain management  Laterality: Left  Prep: chloraprep       Needles:  Injection technique: Single-shot  Needle Type: Echogenic Stimulator Needle     Needle Length: 5cm 5 cm Needle Gauge: 22 and 22 G    Additional Needles:  Procedures: ultrasound guided (picture in chart) and nerve stimulator Interscalene brachial plexus block  Nerve Stimulator or Paresthesia:  Response: biceps flexion, 0.45 mA,   Additional Responses:   Narrative:  Start time: 11/29/2015 10:30 AM End time: 11/29/2015 10:40 AM Injection made incrementally with aspirations every 5 mL.  Performed by: Personally  Anesthesiologist: Shabrea Weldin  Additional Notes: Functioning IV was confirmed and monitors were applied.  A 74mm 22ga Arrow echogenic stimulator needle was used. Sterile prep and drape,hand hygiene and sterile gloves were used.  Negative aspiration and negative test dose prior to incremental administration of local anesthetic. The patient tolerated the procedure well.  Ultrasound guidance: relevent anatomy identified, needle position confirmed, local anesthetic spread visualized around nerve(s), vascular puncture avoided.  Image printed for medical record.

## 2015-11-29 NOTE — Anesthesia Preprocedure Evaluation (Signed)
Anesthesia Evaluation  Patient identified by MRN, date of birth, ID band Patient awake    Reviewed: Allergy & Precautions, NPO status , Patient's Chart, lab work & pertinent test results  Airway Mallampati: II   Neck ROM: full    Dental   Pulmonary neg pulmonary ROS,    breath sounds clear to auscultation       Cardiovascular negative cardio ROS   Rhythm:regular Rate:Normal     Neuro/Psych    GI/Hepatic negative GI ROS,   Endo/Other    Renal/GU    H/o prostate CA, s/p robotic prostatectomy.    Musculoskeletal   Abdominal   Peds  Hematology   Anesthesia Other Findings   Reproductive/Obstetrics                             Anesthesia Physical Anesthesia Plan  ASA: II  Anesthesia Plan: General and Regional   Post-op Pain Management:  Regional for Post-op pain   Induction: Intravenous  Airway Management Planned: Oral ETT  Additional Equipment:   Intra-op Plan:   Post-operative Plan: Extubation in OR  Informed Consent: I have reviewed the patients History and Physical, chart, labs and discussed the procedure including the risks, benefits and alternatives for the proposed anesthesia with the patient or authorized representative who has indicated his/her understanding and acceptance.     Plan Discussed with: CRNA, Anesthesiologist and Surgeon  Anesthesia Plan Comments:         Anesthesia Quick Evaluation

## 2015-11-29 NOTE — Interval H&P Note (Signed)
History and Physical Interval Note:  11/29/2015 10:06 AM  Kenneth Fritz  has presented today for surgery, with the diagnosis of LEFT ROTATOR CUFF TENDONITIS  The various methods of treatment have been discussed with the patient and family. After consideration of risks, benefits and other options for treatment, the patient has consented to  Procedure(s) with comments: SHOULDER ARTHROSCOPY WITH SUBACROMIAL DECOMPRESSION (Left) - AND DEBRIDEMENT Pt notified new arrival time 9:45am per KF 10/16 as a surgical intervention .  The patient's history has been reviewed, patient examined, no change in status, stable for surgery.  I have reviewed the patient's chart and labs.  Questions were answered to the patient's satisfaction.     Arther Abbott

## 2015-11-29 NOTE — Discharge Instructions (Signed)
Shoulder Arthroscopy, Care After Refer to this sheet in the next few weeks. These instructions provide you with information on caring for yourself after your procedure. Your health care provider may also give you more specific instructions. Your treatment has been planned according to current medical practices, but problems sometimes occur. Call your health care provider if you have any problems or questions after your procedure.  WHAT TO EXPECT AFTER THE PROCEDURE After your procedure, it is typical to have the following:  Pain at the site of the surgical cuts (incisions).  Stiffness in your shoulder. This should gradually decrease over time. Your health care provider may recommend physical therapy to help improve this.  Nausea, vomiting, or constipation. These symptoms can result from taking pain medicine after surgery.  Clear or red drainage from the incision sites. This is normal for a few days after surgery.  Fatigue. HOME CARE INSTRUCTIONS  Take medicines only as directed by your health care provider.  Use a sling as directed.  There are many different ways to close and cover an incision, including stitches, skin glue, and adhesive strips. Follow your health care provider's instructions on:  Incision care.  Bandage (dressing) changes and removal.  Incision closure removal.  Apply ice to the injured area:  Put ice in a plastic bag.  Place a towel between your skin and the bag.  Leave the ice on for 20 minutes, 2-3 times a day.  If physical therapy and exercises are prescribed by your health care provider, do them as directed.  Keep all follow-up visits as directed by your health care provider. This is important. SEEK MEDICAL CARE IF: You have a fever. SEEK IMMEDIATE MEDICAL CARE IF:  You have drainage, redness, swelling, or increasing pain at the incision site.  You notice a bad smell coming from the incision site or dressing.  Your incision site breaks open after  your stitches or tape has been removed.   This information is not intended to replace advice given to you by your health care provider. Make sure you discuss any questions you have with your health care provider.   Document Released: 08/24/2013 Document Reviewed: 08/24/2013 Elsevier Interactive Patient Education 2016 [place].   Surgery for Impingement Syndrome, Subacromial Decompression Subacromial decompression surgery is for patients with rotator cuff tendinitis, subacromial bursitis (inflamed, fluid-filled sac between the shoulder joint and top of the shoulder blade), or impingement syndrome (inflamed rotator cuff tendons due to pinching). Surgery is for patients with continued shoulder pain despite at least 3 months of rehabilitation treatment. The shoulder pain is so severe that it affects patients' daily activities or greatly decreases their quality of life. Patients who will benefit most from surgery are those whose shoulder bone (acromion) has a curve, hook, or bump (spur), or those who have a partial rotator cuff tear. There are 3 purposes of surgery. First, the inflamed bursa is removed. Second, the shoulder bone defect (curve, hook, spur) is removed. Third, the coracoacromial ligament is cut. This surgery is intended to reduce pain by increasing space in the area so that the rotator cuff is less likely to be pinched. REASONS NOT TO OPERATE   Infection of the shoulder joint.  Patient is unable or unwilling to complete the postoperative program. This includes keeping the shoulder in a sling or immobilizer (if open surgery is performed), or performing the needed rehabilitation.  Emotional or psychological conditions that contribute to the shoulder condition.  Patients who have rotator cuff inflammation due to other  causes. This includes impingement caused by shoulder instability, weak shoulder blade muscles (scapula), shoulder arthritis, stiff or frozen shoulder, or a large os  acromiale (failure of the shoulder bone growth plates to fuse properly). RISKS AND COMPLICATIONS   Infection.  Bleeding.  Injury to nerves (numbness, weakness, paralysis).  Continued or recurring pain.  Detachment of the deltoid shoulder muscle (if open surgery is performed).  Stiffness or loss of shoulder motion.  Decrease in athletic performance.  Shoulder weakness.  Fracture of the shoulder bone.  Pain in the joint connecting the shoulder bone and collarbone.  Removal of too much or too little shoulder bone. TECHNIQUE Technique used may vary between surgeons. In general, the surgery is performed with a flexible tube and tools inserted in a few small slits near the joint (arthroscopic). It may also be completed through an open cut (incision). The goal of the procedure is to remove the bursa, remove the shoulder bone deformity, and cut the coracoacromial ligament. Electricity will be used to sear the small capillaries (cauterize) to stop small amounts of bleeding. Other tools used are an electric or motorized shaver to remove the bursa, and a small power drill (burr) to remove the deformity of the shoulder bone.  If the procedure is completed with an open incision, the surgeon will detach the deltoid shoulder muscle from the shoulder bone and cut the coracoacromial ligament. The deformity of the shoulder bone is then removed, using a saw or chisel (osteotome). A file (rasp) may be used to smooth the edges. Finally, the bursa is removed with scissors, and the deltoid muscle is reattached to the shoulder bone.  HOME CARE INSTRUCTIONS   Postoperative care depends on the surgical technique used (arthroscopic or open).  Follow the instructions given to you by your surgeon.  Keep the wound clean and dry for 10 to 14 days after surgery, especially if open surgery is performed.  Wear a sling, brace, or immobilizer as prescribed by your surgeon. This often lasts a couple days for  arthroscopic procedures, or 6 to 8 weeks for open procedures, because the deltoid muscle must heal.  You will be given pain medicines by your caregiver or surgeon. Take only as much medicine as you need.  You may be advised to perform motion exercises immediately after surgery. These may be performed at home or with a therapist.  Postoperative rehabilitation and exercises are very important to regain motion, and later, strength. RETURN TO SPORTS   6 weeks is the minimum waiting time required before returning to play. Open procedure surgeries are often longer.  Return to sports depends on the type of sport and the position played.  A therapist must assess your strength and range of motion before athletics may be resumed. SEEK MEDICAL CARE IF:   You experience pain, numbness, or coldness in the hand.  Blue, gray, or dark color appears in the fingernails.  Any of the following occur after surgery:  Increased pain, swelling, redness, drainage of fluids, or bleeding in the affected area.  Signs of infection (headache, muscle aches, dizziness, a general ill feeling with fever).  New, unexplained symptoms develop. (Drugs used in treatment may produce side effects.) Do not eat or drink anything before surgery. Solid food makes general anesthesia more hazardous.    This information is not intended to replace advice given to you by your health care provider. Make sure you discuss any questions you have with your health care provider.   Document Released: 01/27/2005 Document Revised:  02/17/2014 Document Reviewed: 05/11/2008 Elsevier Interactive Patient Education 2016 [place] for your wound as directed by your caregiver.  Wash your hands thoroughly before and after changing your dressings.  Keep the wound and bandages (dressings) clean and dry. Change dressings as directed.  Do not shower or bathe until your caregiver approves. Take sponge baths until you get  approval.  Do not drive until you are no longer taking pain medicine or until your caregiver approves.

## 2015-11-29 NOTE — Transfer of Care (Signed)
Immediate Anesthesia Transfer of Care Note  Patient: Kenneth Fritz  Procedure(s) Performed: Procedure(s) with comments: SHOULDER ARTHROSCOPY WITH SUBACROMIAL DECOMPRESSION (Left) - AND DEBRIDEMENT Pt notified new arrival time 9:45am per KF 10/16  Patient Location: PACU  Anesthesia Type:General  Level of Consciousness: awake  Airway & Oxygen Therapy: Patient Spontanous Breathing and Patient connected to face mask oxygen  Post-op Assessment: Report given to RN  Post vital signs: Reviewed  Last Vitals:  Vitals:   11/29/15 1055 11/29/15 1100  BP: 126/77 119/75  Pulse:    Resp: 17 (!) 23  Temp:      Last Pain:  Vitals:   11/29/15 1013  TempSrc: Oral  PainSc: 6       Patients Stated Pain Goal: 10 ([id number])  Complications: No apparent anesthesia complications

## 2015-12-04 ENCOUNTER — Ambulatory Visit (INDEPENDENT_AMBULATORY_CARE_PROVIDER_SITE_OTHER): Payer: BLUE CROSS/BLUE SHIELD | Admitting: Orthopedic Surgery

## 2015-12-04 ENCOUNTER — Encounter: Payer: Self-pay | Admitting: Orthopedic Surgery

## 2015-12-04 DIAGNOSIS — Z9889 Other specified postprocedural states: Secondary | ICD-10-CM

## 2015-12-04 NOTE — Patient Instructions (Signed)
- 

## 2015-12-04 NOTE — Progress Notes (Signed)
Patient ID: Kenneth Fritz, male   DOB: 27-Jun-1955, 60 y.o.   MRN: [id number]  Post op visit   Chief Complaint  Patient presents with  . Follow-up    LEFT SHOULDER ARTHROSCOPY, DOS 11/29/15    Bursitis and subacromial impingement/acromioplasty bursectomy. Rotator cuff was intact glenohumeral joint was normal and biceps tendon anchor was normal.  The shoulder looks good these portal sites are clean there is no erythema of the sutures are removed  Patient will start outpatient therapy 3 times a week for 3 weeks follow-up in 3 weeks

## 2015-12-05 ENCOUNTER — Ambulatory Visit (HOSPITAL_COMMUNITY): Payer: BLUE CROSS/BLUE SHIELD | Attending: Orthopedic Surgery

## 2015-12-05 ENCOUNTER — Encounter (HOSPITAL_COMMUNITY): Payer: Self-pay

## 2015-12-05 DIAGNOSIS — R29898 Other symptoms and signs involving the musculoskeletal system: Secondary | ICD-10-CM | POA: Diagnosis not present

## 2015-12-05 DIAGNOSIS — M25612 Stiffness of left shoulder, not elsewhere classified: Secondary | ICD-10-CM

## 2015-12-05 DIAGNOSIS — M25512 Pain in left shoulder: Secondary | ICD-10-CM

## 2015-12-05 NOTE — Therapy (Signed)
[facility] [place], [place], [address] Phone: [phone]   Fax:  [phone]  Occupational Therapy Evaluation  Patient Details  Name: Kenneth Fritz MRN: [id number] Date of Birth: 1955-10-15 Referring Provider: Arther Abbott, MD  Encounter Date: 12/05/2015      OT End of Session - 12/05/15 1245    Visit Number 1   Number of Visits 12   Date for OT Re-Evaluation 12/26/15   Authorization Type BCBS   Authorization Time Period Approved for 80 visits combined with PT (02/11/15-02/10/16). 0 used this year. 9 used this year.    Authorization - Visit Number 10   Authorization - Number of Visits 80   OT Start Time 1120   OT Stop Time 1200   OT Time Calculation (min) 40 min   Activity Tolerance Patient tolerated treatment well   Behavior During Therapy WFL for tasks assessed/performed      Past Medical History:  Diagnosis Date  . Cancer ([place]) 11/11   Prostate  . Pernicious anemia     Past Surgical History:  Procedure Laterality Date  . CHOLECYSTECTOMY    . KNEE ARTHROSCOPY Left   . SHOULDER ARTHROSCOPY WITH SUBACROMIAL DECOMPRESSION Left 11/29/2015   Procedure: SHOULDER ARTHROSCOPY WITH SUBACROMIAL DECOMPRESSION;  Surgeon: Carole Civil, MD;  Location: [facility];  Service: Orthopedics;  Laterality: Left;  AND DEBRIDEMENT Pt notified new arrival time 9:45am per KF 10/16  . XI ROBOTIC ASSISTED SIMPLE PROSTATECTOMY     [name]    There were no vitals filed for this visit.      Subjective Assessment - 12/05/15 1123    Subjective  S: It's sore but nothing like it was before.    Currently in Pain? Yes   Pain Score 3    Pain Location Shoulder   Pain Orientation Left   Pain Descriptors / Indicators Aching   Pain Type Surgical pain;Acute pain   Pain Radiating Towards Down arm sometimes   Pain Onset In the past 7 days   Pain Frequency Constant   Aggravating Factors  sleeping on it and movement   Pain  Relieving Factors resting, ice   Effect of Pain on Daily Activities Min effect   Multiple Pain Sites No           [facility] OT Assessment - 12/05/15 1125      Assessment   Diagnosis Left shoulder arthroscopy   Referring Provider Arther Abbott, MD   Onset Date 11/29/15  Surgery   Assessment 12/25/15    Prior Therapy patient received OT services this year for left rotator cuff syndrome     Precautions   Precautions Other (comment)   Precaution Comments No lifting over 5 lbs. per patient. Progress as tolerated. with progressive resistive exercises.      Restrictions   Weight Bearing Restrictions Yes     Balance Screen   Has the patient fallen in the past 6 months No     Home  Environment   Family/patient expects to be discharged to: Private residence     Prior Function   Level of Independence Independent   Vocation Full time employment   Vocation Requirements 40 years of working at [employer]      ADL   ADL comments Unable lift heavy items, reaching overhead or above shoulder.  Difficulty getting shirts on and off.      Mobility   Mobility Status Independent     Written Expression  Dominant Hand Right     Vision - History   Baseline Vision No visual deficits     Cognition   Overall Cognitive Status Within Functional Limits for tasks assessed     ROM / Strength   AROM / PROM / Strength AROM;PROM;Strength     Palpation   Palpation comment Mod fascial restrictions in left upper arm, trapezius, and scapularis region.     AROM   Overall AROM Comments Assessed seated. IR/er adducted   AROM Assessment Site Shoulder   Right/Left Shoulder Left   Left Shoulder Flexion 123 Degrees   Left Shoulder ABduction 110 Degrees   Left Shoulder Internal Rotation 90 Degrees   Left Shoulder External Rotation 46 Degrees     PROM   Overall PROM Comments Assessed supine. IR/er adducted   PROM Assessment Site Shoulder   Right/Left Shoulder Left   Left Shoulder  Flexion 140 Degrees   Left Shoulder ABduction 180 Degrees   Left Shoulder Internal Rotation 90 Degrees   Left Shoulder External Rotation 55 Degrees     Strength   Overall Strength Comments Assessed seated. IR/er adducted   Strength Assessment Site Shoulder   Right/Left Shoulder Left   Left Shoulder Flexion 3-/5   Left Shoulder ABduction 3-/5   Left Shoulder Internal Rotation 3/5   Left Shoulder External Rotation 3-/5                  OT Treatments/Exercises (OP) - 12/05/15 1147      Exercises   Exercises Shoulder     Shoulder Exercises: Seated   Protraction AAROM;10 reps   Horizontal ABduction AAROM;10 reps   External Rotation AAROM;10 reps   Internal Rotation AAROM;10 reps   Flexion AAROM;10 reps   Abduction AAROM;10 reps               OT Education - 12/05/15 1143    Education provided Yes   Education Details AA/ROM shoulder exercises.    Person(s) Educated Patient   Methods Explanation;Demonstration;Verbal cues;Handout   Comprehension Returned demonstration;Verbalized understanding          OT Short Term Goals - 12/05/15 1254      OT SHORT TERM GOAL #1   Title Patient will be educated and independent with HEP to increase functional use of LUE during daily and work related tasks.    Time 4   Period Weeks   Status New     OT SHORT TERM GOAL #2   Title Patient will return to highest level of independence during daily and work related tasks.    Time 4   Period Weeks   Status New     OT SHORT TERM GOAL #3   Title Patient will decrease pain level to 2/10 or less in Left arm when reaching out to the side or overhead.   Time 4   Period Weeks   Status New     OT SHORT TERM GOAL #4   Title Patient will increase left shoulder strength and stability to a 5/5 to increase ability to complete work tasks with less pain.   Time 4   Period Weeks   Status New     OT SHORT TERM GOAL #5   Title Patient will decrease fascial restrictions to min amount  or less to increase functional mobility of LUE.   Time 4   Period Weeks   Status New     OT SHORT TERM GOAL #6   Title Patient will increse A/ROM to  WNL to increase ability to dry back with towel.   Time 4   Period Weeks   Status New                  Plan - 12/05/15 1252    Clinical Impression Statement A: Patient is a 60 y/o male S/P left shoulder arthroscopy causing increased pain, fascial restrictions and decreased strength and ROM resulting in difficulty completing daily tasks using his LUE.    Rehab Potential Excellent   OT Frequency 3x / week   OT Duration 4 weeks   OT Treatment/Interventions Self-care/ADL training;Therapeutic exercise;Patient/family education;Manual Therapy;Ultrasound;Cryotherapy;Electrical Stimulation;Moist Heat;Passive range of motion;DME and/or AE instruction;Therapeutic activities   Plan P: Pt will benefit from skilled OT services to increase functional performance during daily and work tasks. Treatment Plan: Myofascial release, manual stretching, AA/ROM, progress to A/ROM and general shoulder strengthening.    OT Home Exercise Plan 10/25: AA/ROM exercises.    Consulted and Agree with Plan of Care Patient      Patient will benefit from skilled therapeutic intervention in order to improve the following deficits and impairments:  Decreased strength, Decreased range of motion, Pain, Impaired UE functional use, Increased fascial restricitons  Visit Diagnosis: Other symptoms and signs involving the musculoskeletal system - Plan: Ot plan of care cert/re-cert  Stiffness of left shoulder, not elsewhere classified - Plan: Ot plan of care cert/re-cert  Acute pain of left shoulder - Plan: Ot plan of care cert/re-cert    Problem List Patient Active Problem List   Diagnosis Date Noted  . Bursitis of left shoulder   . Tendinitis of left shoulder   . Pernicious anemia 04/18/2013  . Prostate cancer ([place]) 11/10/2012  . 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

## 2015-12-05 NOTE — Patient Instructions (Signed)
Perform each exercise ___10-12_____ reps. 2-3x days.   Protraction - STANDING  Start by holding a wand or cane at chest height.  Next, slowly push the wand outwards in front of your body so that your elbows become fully straightened. Then, return to the original position.     Shoulder FLEXION - STANDING - PALMS UP  In the standing position, hold a wand/cane with both arms, palms up on both sides. Raise up the wand/cane allowing your unaffected arm to perform most of the effort. Your affected arm should be partially relaxed.      Internal/External ROTATION - STANDING  In the standing position, hold a wand/cane with both hands keeping your elbows bent. Move your arms and wand/cane to one side.  Your affected arm should be partially relaxed while your unaffected arm performs most of the effort.       Shoulder ABDUCTION - STANDING  While holding a wand/cane palm face up on the injured side and palm face down on the uninjured side, slowly raise up your injured arm to the side.                     Horizontal Abduction/Adduction      Straight arms holding cane at shoulder height, bring cane to right, center, left. Repeat starting to left.   Copyright  VHI. All rights reserved.       

## 2015-12-11 ENCOUNTER — Ambulatory Visit (HOSPITAL_COMMUNITY): Payer: BLUE CROSS/BLUE SHIELD

## 2015-12-11 ENCOUNTER — Encounter (HOSPITAL_COMMUNITY): Payer: Self-pay

## 2015-12-11 DIAGNOSIS — M25512 Pain in left shoulder: Secondary | ICD-10-CM

## 2015-12-11 DIAGNOSIS — M25612 Stiffness of left shoulder, not elsewhere classified: Secondary | ICD-10-CM

## 2015-12-11 DIAGNOSIS — R29898 Other symptoms and signs involving the musculoskeletal system: Secondary | ICD-10-CM

## 2015-12-11 NOTE — Therapy (Signed)
[facility] [place], [place], [address] Phone: [phone]   Fax:  [phone]  Occupational Therapy Treatment  Patient Details  Name: Kenneth Fritz MRN: [id number] Date of Birth: [birth year] Referring Provider: Arther Abbott, MD  Encounter Date: 12/11/2015      OT End of Session - 12/11/15 [id number]    Visit Number 2   Number of Visits 12   Date for OT Re-Evaluation 12/26/15   Authorization Type BCBS   Authorization Time Period Approved for 80 visits combined with PT (02/11/15-02/10/16). 0 used this year. 9 used this year.    Authorization - Visit Number 11   Authorization - Number of Visits 64   OT Start Time 0945   OT Stop Time 1030   OT Time Calculation (min) 45 min   Activity Tolerance Patient tolerated treatment well   Behavior During Therapy WFL for tasks assessed/performed      Past Medical History:  Diagnosis Date  . Cancer ([place]) 11/11   Prostate  . Pernicious anemia     Past Surgical History:  Procedure Laterality Date  . CHOLECYSTECTOMY    . KNEE ARTHROSCOPY Left   . SHOULDER ARTHROSCOPY WITH SUBACROMIAL DECOMPRESSION Left 11/29/2015   Procedure: SHOULDER ARTHROSCOPY WITH SUBACROMIAL DECOMPRESSION;  Surgeon: Carole Civil, MD;  Location: [facility];  Service: Orthopedics;  Laterality: Left;  AND DEBRIDEMENT Pt notified new arrival time 9:45am per KF 10/16  . XI ROBOTIC ASSISTED SIMPLE PROSTATECTOMY     Kenneth Fritz    There were no vitals filed for this visit.      Subjective Assessment - 12/11/15 1009    Subjective  S: Sleeping on this arm is not comfortable.   Currently in Pain? Yes   Pain Score 2    Pain Location Shoulder   Pain Orientation Left   Pain Descriptors / Indicators Aching   Pain Type Acute pain;Surgical pain            [facility] OT Assessment - 12/11/15 0949      Assessment   Diagnosis Left shoulder arthroscopy     Precautions   Precautions Other (comment)   Precaution Comments No lifting over 5 lbs. per patient. Progress as tolerated. with progressive resistive exercises.                   OT Treatments/Exercises (OP) - 12/11/15 1010      Exercises   Exercises Shoulder     Shoulder Exercises: Supine   Protraction PROM;AAROM;10 reps   Horizontal ABduction PROM;AAROM;10 reps   External Rotation PROM;AAROM;10 reps   Internal Rotation PROM;AAROM;10 reps   Flexion PROM;AAROM;10 reps   ABduction PROM;AAROM;10 reps     Shoulder Exercises: Standing   Protraction AAROM;10 reps   Horizontal ABduction AAROM;10 reps   External Rotation AAROM;10 reps   Internal Rotation AAROM;10 reps   Flexion AAROM;10 reps   ABduction AAROM;10 reps     Shoulder Exercises: Pulleys   Flexion 1 minute   ABduction 1 minute     Shoulder Exercises: ROM/Strengthening   Wall Wash 1'   Thumb Tacks 1'   Proximal Shoulder Strengthening, Supine 10X with no rest breaks     Manual Therapy   Manual Therapy Myofascial release   Manual therapy comments Manual therapy completed prior to exercises.   Myofascial Release Myofascial release and manual stretching completed to left upper arm, trapezius, and scaplaris region to decrease fascial restrictions and increase joint mobility in a pain free  zone.                 OT Education - 12/11/15 1033    Education provided Yes   Education Details Pt was given OT evaluation print out fot goal and plan of care reference.   Person(s) Educated Patient   Methods Explanation;Handout   Comprehension Verbalized understanding          OT Short Term Goals - 12/11/15 1013      OT SHORT TERM GOAL #1   Title Patient will be educated and independent with HEP to increase functional use of LUE during daily and work related tasks.    Time 4   Period Weeks   Status On-going     OT SHORT TERM GOAL #2   Title Patient will return to highest level of independence during daily and work related tasks.    Time 4    Period Weeks   Status On-going     OT SHORT TERM GOAL #3   Title Patient will decrease pain level to 2/10 or less in Left arm when reaching out to the side or overhead.   Time 4   Period Weeks   Status On-going     OT SHORT TERM GOAL #4   Title Patient will increase left shoulder strength and stability to a 5/5 to increase ability to complete work tasks with less pain.   Time 4   Period Weeks   Status New     OT SHORT TERM GOAL #5   Title Patient will decrease fascial restrictions to min amount or less to increase functional mobility of LUE.   Time 4   Period Weeks   Status On-going     OT SHORT TERM GOAL #6   Title Patient will increse A/ROM to WNL to increase ability to dry back with towel.   Time 4   Period Weeks   Status On-going                  Plan - 12/11/15 1034    Clinical Impression Statement A: Initiated myofascial release, manual stretching, AA/ROM exercises and pulleys. patient required initial verbal cueing for form and technique.    Plan P: Continue with AA/ROM exercises. Add muscle energy technique to increase external rotation. Add stretch to HEP if appropriate.      Patient will benefit from skilled therapeutic intervention in order to improve the following deficits and impairments:  Decreased strength, Decreased range of motion, Pain, Impaired UE functional use, Increased fascial restricitons  Visit Diagnosis: Other symptoms and signs involving the musculoskeletal system  Stiffness of left shoulder, not elsewhere classified  Acute pain of left shoulder    Problem List Patient Active Problem List   Diagnosis Date Noted  . Bursitis of left shoulder   . Tendinitis of left shoulder   . Pernicious anemia 04/18/2013  . Prostate cancer ([place]) 11/10/2012  . PIGMENTED VILLONODULAR SYNOVITIS 12/21/2006   Ailene Ravel, OTR/L,CBIS  [phone]  12/11/2015, 10:37 AM  [facility] [place], [place], [address] Phone: [phone]   Fax:  [phone]  Name: Kenneth Fritz MRN: [id number] Date of Birth: 11/10/1955

## 2015-12-12 ENCOUNTER — Encounter (HOSPITAL_COMMUNITY): Payer: Self-pay

## 2015-12-12 ENCOUNTER — Ambulatory Visit (HOSPITAL_COMMUNITY): Payer: BLUE CROSS/BLUE SHIELD | Attending: Orthopedic Surgery

## 2015-12-12 DIAGNOSIS — R29898 Other symptoms and signs involving the musculoskeletal system: Secondary | ICD-10-CM | POA: Diagnosis present

## 2015-12-12 DIAGNOSIS — M25512 Pain in left shoulder: Secondary | ICD-10-CM

## 2015-12-12 DIAGNOSIS — M25612 Stiffness of left shoulder, not elsewhere classified: Secondary | ICD-10-CM | POA: Diagnosis present

## 2015-12-12 NOTE — Therapy (Signed)
[place], [place], [address] Phone: [phone]   Fax:  [phone]  Occupational Therapy Treatment  Patient Details  Name: Kenneth Fritz MRN: [id number] Date of Birth: 12/28/1955 Referring Provider: Arther Abbott, MD  Encounter Date: 12/12/2015      OT End of Session - 12/12/15 1104    Visit Number 3   Number of Visits 12   Date for OT Re-Evaluation 12/26/15   Authorization Type BCBS   Authorization Time Period Approved for 80 visits combined with PT (02/11/15-02/10/16). 0 used this year. 9 used this year.    Authorization - Visit Number 12   Authorization - Number of Visits 39   OT Start Time 1030   OT Stop Time 1108   OT Time Calculation (min) 38 min   Activity Tolerance Patient tolerated treatment well   Behavior During Therapy WFL for tasks assessed/performed      Past Medical History:  Diagnosis Date  . Cancer ([place]) 11/11   Prostate  . Pernicious anemia     Past Surgical History:  Procedure Laterality Date  . CHOLECYSTECTOMY    . KNEE ARTHROSCOPY Left   . SHOULDER ARTHROSCOPY WITH SUBACROMIAL DECOMPRESSION Left 11/29/2015   Procedure: SHOULDER ARTHROSCOPY WITH SUBACROMIAL DECOMPRESSION;  Surgeon: Carole Civil, MD;  Location: [facility];  Service: Orthopedics;  Laterality: Left;  AND DEBRIDEMENT Pt notified new arrival time 9:45am per KF 10/16  . XI ROBOTIC ASSISTED SIMPLE PROSTATECTOMY     [name]    There were no vitals filed for this visit.      Subjective Assessment - 12/12/15 1046    Subjective  S: It was sore last night.   Currently in Pain? Yes   Pain Score 2    Pain Location Shoulder   Pain Orientation Left   Pain Descriptors / Indicators Aching   Pain Type Acute pain;Surgical pain   Pain Radiating Towards down arm sometimes   Pain Onset In the past 7 days   Pain Frequency Constant   Aggravating Factors  sleeping on it and movement   Pain Relieving Factors rest  and ice   Effect of Pain on Daily Activities min effect   Multiple Pain Sites No            [facility] OT Assessment - 12/12/15 1047      Assessment   Diagnosis Left shoulder arthroscopy     Precautions   Precautions Other (comment)   Precaution Comments No lifting over 5 lbs. per patient. Progress as tolerated. with progressive resistive exercises.                   OT Treatments/Exercises (OP) - 12/12/15 1048      Exercises   Exercises Shoulder     Shoulder Exercises: Supine   Protraction PROM;AAROM;10 reps   Horizontal ABduction PROM;AAROM;10 reps   External Rotation PROM;AAROM;10 reps   Internal Rotation PROM;AAROM;10 reps   Flexion PROM;AAROM;10 reps   ABduction PROM;AAROM;10 reps     Shoulder Exercises: Standing   Protraction AAROM;10 reps   Horizontal ABduction AAROM;10 reps   External Rotation AAROM;10 reps   Internal Rotation AAROM;10 reps   Flexion AAROM;10 reps   ABduction AAROM;10 reps     Shoulder Exercises: Pulleys   Flexion 1 minute   ABduction 1 minute     Shoulder Exercises: Therapy Ball   Right/Left 5 reps     Shoulder Exercises: ROM/Strengthening   Wall Wash 1'  Thumb Tacks 1'   Proximal Shoulder Strengthening, Supine 10X with no rest breaks   Proximal Shoulder Strengthening, Seated 10X with no rest breaks     Manual Therapy   Manual Therapy Myofascial release   Manual therapy comments Manual therapy completed prior to exercises.   Myofascial Release Myofascial release and manual stretching completed to left upper arm, trapezius, and scaplaris region to decrease fascial restrictions and increase joint mobility in a pain free zone.                 OT Education - 12/11/15 1033    Education provided Yes   Education Details Pt was given OT evaluation print out fot goal and plan of care reference.   Person(s) Educated Patient   Methods Explanation;Handout   Comprehension Verbalized understanding          OT Short Term  Goals - 12/12/15 1117      OT SHORT TERM GOAL #1   Title Patient will be educated and independent with HEP to increase functional use of LUE during daily and work related tasks.    Time 4   Period Weeks   Status On-going     OT SHORT TERM GOAL #2   Title Patient will return to highest level of independence during daily and work related tasks.    Time 4   Period Weeks   Status On-going     OT SHORT TERM GOAL #3   Title Patient will decrease pain level to 2/10 or less in Left arm when reaching out to the side or overhead.   Time 4   Period Weeks   Status On-going     OT SHORT TERM GOAL #4   Title Patient will increase left shoulder strength and stability to a 5/5 to increase ability to complete work tasks with less pain.   Time 4   Period Weeks   Status On-going     OT SHORT TERM GOAL #5   Title Patient will decrease fascial restrictions to min amount or less to increase functional mobility of LUE.   Time 4   Period Weeks   Status On-going     OT SHORT TERM GOAL #6   Title Patient will increse A/ROM to WNL to increase ability to dry back with towel.   Time 4   Period Weeks   Status On-going                  Plan - 12/12/15 1113    Clinical Impression Statement A: Pt is making progress with external rotation during passive stretching although continues to have increased tightness.    Plan P: Increase repetitions with supine and standing AA/ROM. Add scapular theraband if appropriate.      Patient will benefit from skilled therapeutic intervention in order to improve the following deficits and impairments:  Decreased strength, Decreased range of motion, Pain, Impaired UE functional use, Increased fascial restricitons  Visit Diagnosis: Other symptoms and signs involving the musculoskeletal system  Stiffness of left shoulder, not elsewhere classified  Acute pain of left shoulder    Problem List Patient Active Problem List   Diagnosis Date Noted  .  Bursitis of left shoulder   . Tendinitis of left shoulder   . Pernicious anemia 04/18/2013  . Prostate cancer ([place]) 11/10/2012  . 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

## 2015-12-14 ENCOUNTER — Encounter (HOSPITAL_COMMUNITY): Payer: Self-pay | Admitting: Occupational Therapy

## 2015-12-14 ENCOUNTER — Ambulatory Visit (HOSPITAL_COMMUNITY): Payer: BLUE CROSS/BLUE SHIELD | Admitting: Occupational Therapy

## 2015-12-14 DIAGNOSIS — M25612 Stiffness of left shoulder, not elsewhere classified: Secondary | ICD-10-CM

## 2015-12-14 DIAGNOSIS — R29898 Other symptoms and signs involving the musculoskeletal system: Secondary | ICD-10-CM

## 2015-12-14 DIAGNOSIS — M25512 Pain in left shoulder: Secondary | ICD-10-CM

## 2015-12-14 NOTE — Therapy (Signed)
[place], [place], [address] Phone: [phone]   Fax:  [phone]  Occupational Therapy Treatment  Patient Details  Name: Kenneth Fritz MRN: [id number] Date of Birth: 1955/07/19 Referring Provider: Arther Abbott, MD  Encounter Date: 12/14/2015      OT End of Session - 12/14/15 1143    Visit Number 4   Number of Visits 12   Date for OT Re-Evaluation 12/26/15   Authorization Type BCBS   Authorization Time Period Approved for 80 visits combined with PT (02/11/15-02/10/16). 0 used this year. 9 used this year.    Authorization - Visit Number 13   Authorization - Number of Visits 80   OT Start Time 1031   OT Stop Time 1111   OT Time Calculation (min) 40 min   Activity Tolerance Patient tolerated treatment well   Behavior During Therapy WFL for tasks assessed/performed      Past Medical History:  Diagnosis Date  . Cancer ([place]) 11/11   Prostate  . Pernicious anemia     Past Surgical History:  Procedure Laterality Date  . CHOLECYSTECTOMY    . KNEE ARTHROSCOPY Left   . SHOULDER ARTHROSCOPY WITH SUBACROMIAL DECOMPRESSION Left 11/29/2015   Procedure: SHOULDER ARTHROSCOPY WITH SUBACROMIAL DECOMPRESSION;  Surgeon: Carole Civil, MD;  Location: [facility];  Service: Orthopedics;  Laterality: Left;  AND DEBRIDEMENT Pt notified new arrival time 9:45am per KF 10/16  . XI ROBOTIC ASSISTED SIMPLE PROSTATECTOMY     [name]    There were no vitals filed for this visit.      Subjective Assessment - 12/14/15 1031    Subjective  S: Nighttime is terrible.    Currently in Pain? Yes   Pain Score 3    Pain Location Shoulder   Pain Orientation Left   Pain Descriptors / Indicators Aching   Pain Type Acute pain;Surgical pain   Pain Radiating Towards down arm at times   Pain Onset In the past 7 days   Pain Frequency Constant   Aggravating Factors  sleeping on it, movement   Pain Relieving Factors rest and  ice    Effect of Pain on Daily Activities limited ability to use LUE during daily tasks.   Multiple Pain Sites No            [facility] OT Assessment - 12/14/15 1031      Assessment   Diagnosis Left shoulder arthroscopy     Precautions   Precautions Other (comment)   Precaution Comments No lifting over 5 lbs. per patient. Progress as tolerated. with progressive resistive exercises.                   OT Treatments/Exercises (OP) - 12/14/15 1034      Exercises   Exercises Shoulder     Shoulder Exercises: Supine   Protraction PROM;5 reps;AAROM;12 reps   Horizontal ABduction PROM;5 reps;AAROM;12 reps   External Rotation PROM;5 reps;AAROM;12 reps   Internal Rotation PROM;5 reps;AAROM;12 reps   Flexion PROM;5 reps;AAROM;12 reps   ABduction PROM;5 reps;AAROM;12 reps     Shoulder Exercises: Standing   Protraction AAROM;12 reps   Horizontal ABduction AAROM;12 reps   External Rotation AAROM;12 reps   Internal Rotation AAROM;12 reps   Flexion AAROM;12 reps   ABduction AAROM;12 reps   Row Theraband;10 reps   Theraband Level (Shoulder Row) Level 2 (Red)   Retraction Theraband;10 reps   Theraband Level (Shoulder Retraction) Level 2 (Red)  Shoulder Exercises: Pulleys   Flexion 2 minutes   ABduction 2 minutes     Shoulder Exercises: ROM/Strengthening   Proximal Shoulder Strengthening, Supine 12X each no rest breaks   Proximal Shoulder Strengthening, Seated 10X with no rest breaks     Shoulder Exercises: Stretch   Internal Rotation Stretch 3 reps  10 seconds   External Rotation Stretch 3 reps;10 seconds   Wall Stretch - Flexion 3 reps;10 seconds     Manual Therapy   Manual Therapy Myofascial release   Manual therapy comments Manual therapy completed prior to exercises.   Myofascial Release Myofascial release and manual stretching completed to left upper arm, trapezius, and scaplaris region to decrease fascial restrictions and increase joint mobility in a pain free  zone.                   OT Short Term Goals - 12/12/15 1117      OT SHORT TERM GOAL #1   Title Patient will be educated and independent with HEP to increase functional use of LUE during daily and work related tasks.    Time 4   Period Weeks   Status On-going     OT SHORT TERM GOAL #2   Title Patient will return to highest level of independence during daily and work related tasks.    Time 4   Period Weeks   Status On-going     OT SHORT TERM GOAL #3   Title Patient will decrease pain level to 2/10 or less in Left arm when reaching out to the side or overhead.   Time 4   Period Weeks   Status On-going     OT SHORT TERM GOAL #4   Title Patient will increase left shoulder strength and stability to a 5/5 to increase ability to complete work tasks with less pain.   Time 4   Period Weeks   Status On-going     OT SHORT TERM GOAL #5   Title Patient will decrease fascial restrictions to min amount or less to increase functional mobility of LUE.   Time 4   Period Weeks   Status On-going     OT SHORT TERM GOAL #6   Title Patient will increse A/ROM to WNL to increase ability to dry back with towel.   Time 4   Period Weeks   Status On-going                  Plan - 12/14/15 1143    Clinical Impression Statement A: Increased AA/ROM repetitions to 12 supine and standing, added scapular theraband row and retraction. Increased pulleys to 2 minutes, added shoulder stretches. Pt did well with shoulder stretches, verbal cuing for form and technqiue.    Plan P: Add scapular theraband extension, continue shoulder stretches and provide HEP when appropriate.    OT Home Exercise Plan 10/25: AA/ROM exercises.       Patient will benefit from skilled therapeutic intervention in order to improve the following deficits and impairments:     Visit Diagnosis: Other symptoms and signs involving the musculoskeletal system  Stiffness of left shoulder, not elsewhere  classified  Acute pain of left shoulder    Problem List Patient Active Problem List   Diagnosis Date Noted  . Bursitis of left shoulder   . Tendinitis of left shoulder   . Pernicious anemia 04/18/2013  . Prostate cancer ([place]) 11/10/2012  . 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

## 2015-12-17 ENCOUNTER — Ambulatory Visit (HOSPITAL_COMMUNITY): Payer: BLUE CROSS/BLUE SHIELD | Admitting: Specialist

## 2015-12-17 DIAGNOSIS — M25612 Stiffness of left shoulder, not elsewhere classified: Secondary | ICD-10-CM

## 2015-12-17 DIAGNOSIS — R29898 Other symptoms and signs involving the musculoskeletal system: Secondary | ICD-10-CM | POA: Diagnosis not present

## 2015-12-17 DIAGNOSIS — M25512 Pain in left shoulder: Secondary | ICD-10-CM

## 2015-12-17 NOTE — Therapy (Signed)
[place], [place], [address] Phone: [phone]   Fax:  [phone]  Occupational Therapy Treatment  Patient Details  Name: JAMEN ABBONDANZA MRN: [id number] Date of Birth: September 21, 1955 Referring Provider: Arther Abbott, MD  Encounter Date: 12/17/2015      OT End of Session - 12/17/15 1458    Visit Number 5   Number of Visits 12   Date for OT Re-Evaluation 12/26/15   Authorization Type BCBS   Authorization Time Period Approved for 80 visits combined with PT (02/11/15-02/10/16). 0 used this year. 9 used this year.    Authorization - Visit Number 14   Authorization - Number of Visits 83   OT Start Time [phone]   OT Stop Time 1030   OT Time Calculation (min) 39 min   Activity Tolerance Patient tolerated treatment well   Behavior During Therapy WFL for tasks assessed/performed      Past Medical History:  Diagnosis Date  . Cancer ([facility]) 11/11   Prostate  . Pernicious anemia     Past Surgical History:  Procedure Laterality Date  . CHOLECYSTECTOMY    . KNEE ARTHROSCOPY Left   . SHOULDER ARTHROSCOPY WITH SUBACROMIAL DECOMPRESSION Left 11/29/2015   Procedure: SHOULDER ARTHROSCOPY WITH SUBACROMIAL DECOMPRESSION;  Surgeon: Carole Civil, MD;  Location: [facility];  Service: Orthopedics;  Laterality: Left;  AND DEBRIDEMENT Pt notified new arrival time 9:45am per KF 10/16  . XI ROBOTIC ASSISTED SIMPLE PROSTATECTOMY     [name]    There were no vitals filed for this visit.      Subjective Assessment - 12/17/15 0952    Subjective  S:  When I move it, it hurts.  I am doing my exercises.   Currently in Pain? Yes   Pain Score 4    Pain Location Shoulder   Pain Orientation Left   Pain Descriptors / Indicators Aching   Pain Type Acute pain            [facility] OT Assessment - 12/17/15 0001      Assessment   Diagnosis Left shoulder arthroscopy     Precautions   Precautions Other (comment)   Precaution  Comments No lifting over 5 lbs. per patient. Progress as tolerated. with progressive resistive exercises.                   OT Treatments/Exercises (OP) - 12/17/15 0001      Exercises   Exercises Shoulder     Shoulder Exercises: Supine   Protraction PROM;5 reps;AAROM;15 reps   Horizontal ABduction PROM;5 reps;AAROM;15 reps   External Rotation PROM;5 reps;AAROM;15 reps   Internal Rotation PROM;5 reps;AAROM;15 reps   Flexion PROM;5 reps;AAROM;15 reps   ABduction PROM;5 reps;AAROM;15 reps     Shoulder Exercises: Standing   Protraction AAROM;15 reps   Horizontal ABduction AAROM;15 reps   External Rotation AAROM;15 reps   Internal Rotation AAROM;15 reps   Flexion AAROM;15 reps   ABduction AAROM;15 reps   Extension Theraband;15 reps   Theraband Level (Shoulder Extension) Level 2 (Red)   Row Theraband;15 reps   Theraband Level (Shoulder Row) Level 2 (Red)   Retraction Theraband;15 reps   Theraband Level (Shoulder Retraction) Level 2 (Red)     Shoulder Exercises: Pulleys   Flexion 2 minutes   ABduction 2 minutes     Manual Therapy   Manual Therapy Myofascial release   Manual therapy comments Manual therapy completed prior to exercises.   Myofascial Release  Myofascial release and manual stretching completed to left upper arm, trapezius, and scaplaris region to decrease fascial restrictions and increase joint mobility in a pain free zone.                   OT Short Term Goals - 12/12/15 1117      OT SHORT TERM GOAL #1   Title Patient will be educated and independent with HEP to increase functional use of LUE during daily and work related tasks.    Time 4   Period Weeks   Status On-going     OT SHORT TERM GOAL #2   Title Patient will return to highest level of independence during daily and work related tasks.    Time 4   Period Weeks   Status On-going     OT SHORT TERM GOAL #3   Title Patient will decrease pain level to 2/10 or less in Left arm when  reaching out to the side or overhead.   Time 4   Period Weeks   Status On-going     OT SHORT TERM GOAL #4   Title Patient will increase left shoulder strength and stability to a 5/5 to increase ability to complete work tasks with less pain.   Time 4   Period Weeks   Status On-going     OT SHORT TERM GOAL #5   Title Patient will decrease fascial restrictions to min amount or less to increase functional mobility of LUE.   Time 4   Period Weeks   Status On-going     OT SHORT TERM GOAL #6   Title Patient will increse A/ROM to WNL to increase ability to dry back with towel.   Time 4   Period Weeks   Status On-going                  Plan - 12/17/15 1459    Clinical Impression Statement A:  Good form with most therapeutic exercises, occasional verbal guidance in standing to depress shoulder blade with AA/ROM required.  Added scapular theraband.   Plan P:  Follow up on shoulder stretches, attempt A/ROM in supine, add scapular stability such as proximal shoulder strengthening.       Patient will benefit from skilled therapeutic intervention in order to improve the following deficits and impairments:     Visit Diagnosis: Other symptoms and signs involving the musculoskeletal system  Stiffness of left shoulder, not elsewhere classified  Acute pain of left shoulder    Problem List Patient Active Problem List   Diagnosis Date Noted  . Bursitis of left shoulder   . Tendinitis of left shoulder   . Pernicious anemia 04/18/2013  . Prostate cancer ([facility]) 11/10/2012  . 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

## 2015-12-20 ENCOUNTER — Ambulatory Visit (HOSPITAL_COMMUNITY): Payer: BLUE CROSS/BLUE SHIELD | Admitting: Occupational Therapy

## 2015-12-20 ENCOUNTER — Encounter (HOSPITAL_COMMUNITY): Payer: Self-pay | Admitting: Occupational Therapy

## 2015-12-20 DIAGNOSIS — R29898 Other symptoms and signs involving the musculoskeletal system: Secondary | ICD-10-CM

## 2015-12-20 DIAGNOSIS — M25612 Stiffness of left shoulder, not elsewhere classified: Secondary | ICD-10-CM

## 2015-12-20 DIAGNOSIS — M25512 Pain in left shoulder: Secondary | ICD-10-CM

## 2015-12-20 NOTE — Patient Instructions (Signed)
Complete 1-2x/day  (Home) Extension: Isometric / Bilateral Arm Retraction - Sitting   Facing anchor, hold hands and elbow at shoulder height, with elbow bent.  Pull arms back to squeeze shoulder blades together. Repeat 10-15 times.  Copyright  VHI. All rights reserved.   (Home) Retraction: Row - Bilateral (Anchor)   Facing anchor, arms reaching forward, pull hands toward stomach, keeping elbows bent and at your sides and pinching shoulder blades together. Repeat 10-15 times.  Copyright  VHI. All rights reserved.   (Clinic) Extension / Flexion (Assist)   Face anchor, pull arms back, keeping elbow straight, and squeze shoulder blades together. Repeat 10-15 times.   Copyright  VHI. All rights reserved.

## 2015-12-20 NOTE — Therapy (Signed)
[place], [place], [address] Phone: [phone]   Fax:  [phone]  Occupational Therapy Treatment  Patient Details  Name: Kenneth Fritz MRN: [id number] Date of Birth: 05-13-1955 Referring Provider: Arther Abbott, MD  Encounter Date: 12/20/2015      OT End of Session - 12/20/15 1110    Visit Number 6   Number of Visits 12   Date for OT Re-Evaluation 12/26/15   Authorization Type BCBS   Authorization Time Period Approved for 80 visits combined with PT (02/11/15-02/10/16). 0 used this year. 9 used this year.    Authorization - Visit Number 15   Authorization - Number of Visits 57   OT Start Time 1030   OT Stop Time 1110   OT Time Calculation (min) 40 min   Activity Tolerance Patient tolerated treatment well   Behavior During Therapy WFL for tasks assessed/performed      Past Medical History:  Diagnosis Date  . Cancer ([place]) 11/11   Prostate  . Pernicious anemia     Past Surgical History:  Procedure Laterality Date  . CHOLECYSTECTOMY    . KNEE ARTHROSCOPY Left   . SHOULDER ARTHROSCOPY WITH SUBACROMIAL DECOMPRESSION Left 11/29/2015   Procedure: SHOULDER ARTHROSCOPY WITH SUBACROMIAL DECOMPRESSION;  Surgeon: Carole Civil, MD;  Location: [facility];  Service: Orthopedics;  Laterality: Left;  AND DEBRIDEMENT Pt notified new arrival time 9:45am per KF 10/16  . XI ROBOTIC ASSISTED SIMPLE PROSTATECTOMY     [name]    There were no vitals filed for this visit.      Subjective Assessment - 12/20/15 1031    Subjective  S:    Currently in Pain? Yes   Pain Score 2    Pain Location Shoulder   Pain Orientation Left   Pain Descriptors / Indicators Aching   Pain Type Acute pain   Pain Radiating Towards n/a   Pain Onset In the past 7 days   Pain Frequency Intermittent   Aggravating Factors  sleeping on it, movement   Pain Relieving Factors rest and ice   Effect of Pain on Daily Activities limited  ability to use LUE during daily tasks   Multiple Pain Sites No            [facility] OT Assessment - 12/20/15 1030      Assessment   Diagnosis Left shoulder arthroscopy     Precautions   Precautions Other (comment)   Precaution Comments No lifting over 5 lbs. per patient. Progress as tolerated. with progressive resistive exercises.                   OT Treatments/Exercises (OP) - 12/20/15 1034      Exercises   Exercises Shoulder     Shoulder Exercises: Supine   Protraction PROM;5 reps;AROM;12 reps   Horizontal ABduction PROM;5 reps;AROM;12 reps   External Rotation PROM;5 reps;AROM;12 reps   Internal Rotation PROM;5 reps;AROM;12 reps   Flexion PROM;5 reps;AROM;12 reps   ABduction PROM;5 reps;AROM;12 reps     Shoulder Exercises: Standing   Protraction AROM;12 reps   Horizontal ABduction AROM;12 reps   External Rotation AROM;12 reps   Internal Rotation AROM;12 reps   Flexion AROM;12 reps   ABduction AROM;12 reps   Extension Theraband;15 reps   Theraband Level (Shoulder Extension) Level 2 (Red)   Row Theraband;15 reps   Theraband Level (Shoulder Row) Level 2 (Red)   Retraction Theraband;15 reps   Theraband Level (Shoulder Retraction)  Level 2 (Red)     Shoulder Exercises: ROM/Strengthening   UBE (Upper Arm Bike) Level 1 2' forward 2' reverse   Proximal Shoulder Strengthening, Supine 12X each no rest breaks   Proximal Shoulder Strengthening, Seated 12X with no rest breaks     Shoulder Exercises: Stretch   Internal Rotation Stretch 3 reps  10 seconds     Manual Therapy   Manual Therapy Myofascial release   Manual therapy comments Manual therapy completed prior to exercises.   Myofascial Release Myofascial release and manual stretching completed to left upper arm, trapezius, and scaplaris region to decrease fascial restrictions and increase joint mobility in a pain free zone.                 OT Education - 12/20/15 1106    Education provided Yes    Education Details scapular theraband   Person(s) Educated Patient   Methods Explanation;Demonstration;Handout   Comprehension Verbalized understanding;Returned demonstration          OT Short Term Goals - 12/12/15 1117      OT SHORT TERM GOAL #1   Title Patient will be educated and independent with HEP to increase functional use of LUE during daily and work related tasks.    Time 4   Period Weeks   Status On-going     OT SHORT TERM GOAL #2   Title Patient will return to highest level of independence during daily and work related tasks.    Time 4   Period Weeks   Status On-going     OT SHORT TERM GOAL #3   Title Patient will decrease pain level to 2/10 or less in Left arm when reaching out to the side or overhead.   Time 4   Period Weeks   Status On-going     OT SHORT TERM GOAL #4   Title Patient will increase left shoulder strength and stability to a 5/5 to increase ability to complete work tasks with less pain.   Time 4   Period Weeks   Status On-going     OT SHORT TERM GOAL #5   Title Patient will decrease fascial restrictions to min amount or less to increase functional mobility of LUE.   Time 4   Period Weeks   Status On-going     OT SHORT TERM GOAL #6   Title Patient will increse A/ROM to WNL to increase ability to dry back with towel.   Time 4   Period Weeks   Status On-going                  Plan - 12/20/15 1110    Clinical Impression Statement A: Progressed to A/ROM in supine and standing, added UBE, pt with good form for exercises, occasional verbal cuing for speed and end stretch. Continued scapular theraband, added to HEP, verbal cuing for form.    Plan P: Follow up on scapular theraband HEP, continue with A/ROM adding to HEP when appropriate.    OT Home Exercise Plan 10/25: AA/ROM exercises. 11/9: scapular theraband (red)   Consulted and Agree with Plan of Care Patient      Patient will benefit from skilled therapeutic intervention in  order to improve the following deficits and impairments:  Decreased strength, Decreased range of motion, Pain, Impaired UE functional use, Increased fascial restricitons  Visit Diagnosis: Other symptoms and signs involving the musculoskeletal system  Stiffness of left shoulder, not elsewhere classified  Acute pain of left shoulder  Problem List Patient Active Problem List   Diagnosis Date Noted  . Bursitis of left shoulder   . Tendinitis of left shoulder   . Pernicious anemia 04/18/2013  . Prostate cancer ([place]) 11/10/2012  . 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

## 2015-12-21 ENCOUNTER — Ambulatory Visit (HOSPITAL_COMMUNITY): Payer: BLUE CROSS/BLUE SHIELD | Admitting: Occupational Therapy

## 2015-12-21 ENCOUNTER — Encounter (HOSPITAL_COMMUNITY): Payer: Self-pay | Admitting: Occupational Therapy

## 2015-12-21 DIAGNOSIS — M25512 Pain in left shoulder: Secondary | ICD-10-CM

## 2015-12-21 DIAGNOSIS — M25612 Stiffness of left shoulder, not elsewhere classified: Secondary | ICD-10-CM

## 2015-12-21 DIAGNOSIS — R29898 Other symptoms and signs involving the musculoskeletal system: Secondary | ICD-10-CM | POA: Diagnosis not present

## 2015-12-21 NOTE — Therapy (Signed)
[place], [place], [address] Phone: [phone]   Fax:  [phone]  Occupational Therapy Treatment  Patient Details  Name: Kenneth Fritz MRN: [id number] Date of Birth: 1956-02-02 Referring Provider: Arther Abbott, MD  Encounter Date: 12/21/2015      OT End of Session - 12/21/15 [id number]    Visit Number 7   Number of Visits 12   Date for OT Re-Evaluation 12/26/15   Authorization Type BCBS   Authorization Time Period Approved for 80 visits combined with PT (02/11/15-02/10/16). 0 used this year. 9 used this year.    Authorization - Visit Number 16   Authorization - Number of Visits 38   OT Start Time [id number]   OT Stop Time 1111   OT Time Calculation (min) 38 min   Activity Tolerance Patient tolerated treatment well   Behavior During Therapy WFL for tasks assessed/performed      Past Medical History:  Diagnosis Date  . Cancer ([place]) 11/11   Prostate  . Pernicious anemia     Past Surgical History:  Procedure Laterality Date  . CHOLECYSTECTOMY    . KNEE ARTHROSCOPY Left   . SHOULDER ARTHROSCOPY WITH SUBACROMIAL DECOMPRESSION Left 11/29/2015   Procedure: SHOULDER ARTHROSCOPY WITH SUBACROMIAL DECOMPRESSION;  Surgeon: Carole Civil, MD;  Location: [facility];  Service: Orthopedics;  Laterality: Left;  AND DEBRIDEMENT Pt notified new arrival time 9:45am per KF 10/16  . XI ROBOTIC ASSISTED SIMPLE PROSTATECTOMY     [name]    There were no vitals filed for this visit.      Subjective Assessment - 12/21/15 1034    Subjective  S: It only hurts when I do something like dry my back or put my shirt on.    Currently in Pain? No/denies            [facility] OT Assessment - 12/21/15 1221      Assessment   Diagnosis Left shoulder arthroscopy     Precautions   Precautions Other (comment)   Precaution Comments No lifting over 5 lbs. per patient. Progress as tolerated. with progressive resistive exercises.                    OT Treatments/Exercises (OP) - 12/21/15 1034      Exercises   Exercises Shoulder     Shoulder Exercises: Supine   Protraction PROM;5 reps;AROM;12 reps   Horizontal ABduction PROM;5 reps;AROM;12 reps   External Rotation PROM;5 reps;AROM;12 reps   Internal Rotation PROM;5 reps;AROM;12 reps   Flexion PROM;5 reps;AROM;12 reps   ABduction PROM;5 reps;AROM;12 reps     Shoulder Exercises: Standing   Protraction AROM;12 reps   Horizontal ABduction AROM;12 reps   External Rotation AROM;12 reps   Internal Rotation AROM;12 reps   Flexion AROM;12 reps   ABduction AROM;12 reps   Extension Theraband;15 reps   Theraband Level (Shoulder Extension) Level 2 (Red)   Row Theraband;15 reps   Theraband Level (Shoulder Row) Level 2 (Red)   Retraction Theraband;15 reps   Theraband Level (Shoulder Retraction) Level 2 (Red)     Shoulder Exercises: ROM/Strengthening   UBE (Upper Arm Bike) Level 1 3' forward 3' reverse   Over Head Lace 1'   Proximal Shoulder Strengthening, Supine 12X each no rest breaks   Proximal Shoulder Strengthening, Seated 12X with no rest breaks     Shoulder Exercises: Stretch   Internal Rotation Stretch 3 reps  10 seconds   External Rotation Stretch  3 reps;10 seconds     Manual Therapy   Manual Therapy Myofascial release   Manual therapy comments Manual therapy completed prior to exercises.   Myofascial Release Myofascial release and manual stretching completed to left upper arm, trapezius, and scaplaris region to decrease fascial restrictions and increase joint mobility in a pain free zone.                OT Short Term Goals - 12/12/15 1117      OT SHORT TERM GOAL #1   Title Patient will be educated and independent with HEP to increase functional use of LUE during daily and work related tasks.    Time 4   Period Weeks   Status On-going     OT SHORT TERM GOAL #2   Title Patient will return to highest level of independence during  daily and work related tasks.    Time 4   Period Weeks   Status On-going     OT SHORT TERM GOAL #3   Title Patient will decrease pain level to 2/10 or less in Left arm when reaching out to the side or overhead.   Time 4   Period Weeks   Status On-going     OT SHORT TERM GOAL #4   Title Patient will increase left shoulder strength and stability to a 5/5 to increase ability to complete work tasks with less pain.   Time 4   Period Weeks   Status On-going     OT SHORT TERM GOAL #5   Title Patient will decrease fascial restrictions to min amount or less to increase functional mobility of LUE.   Time 4   Period Weeks   Status On-going     OT SHORT TERM GOAL #6   Title Patient will increse A/ROM to WNL to increase ability to dry back with towel.   Time 4   Period Weeks   Status On-going                  Plan - 12/21/15 1222    Clinical Impression Statement A: Continued with A/ROM, intermittent verbal cuing for form and technique. Added overhead lacing, continued with stretches-er and IR. Pt requires verbal and tactile cuing for scapular retraction during theraband exercises, has not attempted theraband HEP.    Plan P: Continue working on independence in scapular theraband exercises, add A/ROM in sidelying, update HEP for A/ROM   OT Home Exercise Plan 10/25: AA/ROM exercises. 11/9: scapular theraband (red)   Consulted and Agree with Plan of Care Patient      Patient will benefit from skilled therapeutic intervention in order to improve the following deficits and impairments:  Decreased strength, Decreased range of motion, Pain, Impaired UE functional use, Increased fascial restricitons  Visit Diagnosis: Other symptoms and signs involving the musculoskeletal system  Stiffness of left shoulder, not elsewhere classified  Acute pain of left shoulder    Problem List Patient Active Problem List   Diagnosis Date Noted  . Bursitis of left shoulder   . Tendinitis of  left shoulder   . Pernicious anemia 04/18/2013  . Prostate cancer ([place]) 11/10/2012  . 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

## 2015-12-24 ENCOUNTER — Ambulatory Visit (HOSPITAL_COMMUNITY): Payer: BLUE CROSS/BLUE SHIELD | Admitting: Specialist

## 2015-12-24 DIAGNOSIS — M25612 Stiffness of left shoulder, not elsewhere classified: Secondary | ICD-10-CM

## 2015-12-24 DIAGNOSIS — M25512 Pain in left shoulder: Secondary | ICD-10-CM

## 2015-12-24 DIAGNOSIS — R29898 Other symptoms and signs involving the musculoskeletal system: Secondary | ICD-10-CM

## 2015-12-24 NOTE — Patient Instructions (Signed)
Complete each exercise 10 times 1-2 times per day  ROM: Abduction (Standing)   Bring arms straight out from sides and raise as high as possible without pain. Repeat ____ times per set. Do ____ sets per session. Do ____ sessions per day.  http://orth.exer.us/910   Copyright  VHI. All rights reserved.   Extension (Active) ROM: Extension (Standing)   Bring arms straight back as far as possible without pain. Repeat ____ times per set. Do ____ sets per session. Do ____ sessions per day.  http://orth.exer.us/916   Copyright  VHI. All rights reserved.   ROM: External / Internal Rotation - in Abduction (Standing)   With upper arms parallel to floor and elbows bent at right angles, gently rotate arms up then down as far as possible without pain. Repeat ____ times per set. Do ____ sets per session. Do ____ sessions per day.  http://orth.exer.us/912   Copyright  VHI. All rights reserved.    Flexors Stretch (Active)   Stand, arms straight at sides. Bring arms straight forward and upward as high as possible without pain. Hold ___ seconds. Repeat ___ times per session. Do ___ sessions per day.  Copyright  VHI. All rights reserved.   Scapular Retraction (Standing)   With arms at sides, pinch shoulder blades together. Repeat ____ times per set. Do ____ sets per session. Do ____ sessions per day.  http://orth.exer.us/944   Copyright  VHI. All rights reserved.

## 2015-12-24 NOTE — Therapy (Signed)
Kenneth Fritz, [place], [address] Phone: [phone]   Fax:  [phone]  Occupational Therapy Treatment  Patient Details  Name: Kenneth Fritz MRN: [id number] Date of Birth: 19-Jan-1956 Referring Provider: Arther Abbott, MD  Encounter Date: 12/24/2015      OT End of Session - 12/24/15 1543    Visit Number 8   Number of Visits 12   Date for OT Re-Evaluation 01/23/16   Authorization Type BCBS   Authorization Time Period Approved for 80 visits combined with PT (02/11/15-02/10/16). 0 used this year. 9 used this year.    Authorization - Visit Number 17   Authorization - Number of Visits 80   OT Start Time [phone]   OT Stop Time 1032   OT Time Calculation (min) 38 min   Activity Tolerance Patient tolerated treatment well   Behavior During Therapy WFL for tasks assessed/performed      Past Medical History:  Diagnosis Date  . Cancer ([place]) 11/11   Prostate  . Pernicious anemia     Past Surgical History:  Procedure Laterality Date  . CHOLECYSTECTOMY    . KNEE ARTHROSCOPY Left   . SHOULDER ARTHROSCOPY WITH SUBACROMIAL DECOMPRESSION Left 11/29/2015   Procedure: SHOULDER ARTHROSCOPY WITH SUBACROMIAL DECOMPRESSION;  Surgeon: Kenneth Civil, MD;  Location: [facility];  Service: Orthopedics;  Laterality: Left;  AND DEBRIDEMENT Pt notified new arrival time 9:45am per KF 10/16  . XI ROBOTIC ASSISTED SIMPLE PROSTATECTOMY     Kenneth Fritz    There were no vitals filed for this visit.      Subjective Assessment - 12/24/15 0955    Subjective  S:  I dont know what happened on Friday, my arm has hurt ever since.    Currently in Pain? Yes   Pain Score 4    Pain Location Shoulder   Pain Orientation Left   Pain Descriptors / Indicators Aching   Pain Type Acute pain   Pain Onset More than a month ago   Pain Frequency Intermittent   Aggravating Factors  use of arm   Pain Relieving Factors rest   Effect of Pain on Daily  Activities limited use of left arm             [facility] OT Assessment - 12/24/15 0001      Assessment   Diagnosis Left shoulder arthroscopy     Precautions   Precautions Other (comment)   Precaution Comments No lifting over 5 lbs. per patient. Progress as tolerated. with progressive resistive exercises.      Restrictions   Weight Bearing Restrictions Yes     Balance Screen   Has the patient fallen in the past 6 months No     Observation/Other Assessments   Skin Integrity minimal fascial restrictions in left shoulder region    Focus on Therapeutic Outcomes (FOTO)  62.3     Palpation   Palpation comment minimal fascial restrictions noted in left shoulder region.      AROM   Overall AROM Comments assessed in seated IR/er adducted  (initial evaluation)   Left Shoulder Flexion 150 Degrees  123   Left Shoulder ABduction 160 Degrees  110   Left Shoulder Internal Rotation 90 Degrees  90   Left Shoulder External Rotation 50 Degrees  46     PROM   Overall PROM Comments Assessed supine. IR/er adducted   Left Shoulder Flexion 160 Degrees  140   Left Shoulder ABduction 175 Degrees  180   Left Shoulder Internal Rotation 90 Degrees  90   Left Shoulder External Rotation 55 Degrees  55     Strength   Overall Strength Comments Assessed seated. IR/er adducted   Left Shoulder Flexion 4+/5  3-/5   Left Shoulder ABduction 4+/5  3-/5   Left Shoulder Internal Rotation 4+/5  3-/5   Left Shoulder External Rotation 4+/5  3-/5                  OT Treatments/Exercises (OP) - 12/24/15 0001      Exercises   Exercises Shoulder     Shoulder Exercises: Supine   Protraction PROM;5 reps   Horizontal ABduction PROM;5 reps   External Rotation PROM;5 reps   Internal Rotation PROM;5 reps   Flexion PROM;5 reps   ABduction PROM;5 reps     Shoulder Exercises: Sidelying   External Rotation Strengthening;10 reps   External Rotation Weight (lbs) 1   Internal Rotation  Strengthening;10 reps   Internal Rotation Weight (lbs) 1   Flexion Strengthening;10 reps   Flexion Weight (lbs) 1   ABduction Strengthening;10 reps   ABduction Weight (lbs) 1   Other Sidelying Exercises protraction and horizontal abduction with 1# resist 10 times      Shoulder Exercises: Standing   Extension Theraband;15 reps   Theraband Level (Shoulder Extension) Level 3 (Green)   Row [employer] reps   Theraband Level (Shoulder Row) Level 3 (Green)   Retraction Theraband;15 reps   Theraband Level (Shoulder Retraction) Level 3 (Green)     Manual Therapy   Manual Therapy Myofascial release   Manual therapy comments Manual therapy completed prior to exercises.   Myofascial Release Myofascial release and manual stretching completed to left upper arm, trapezius, and scaplaris region to decrease fascial restrictions and increase joint mobility in a pain free zone.                 OT Education - 12/24/15 1542    Education provided Yes   Education Details  A/ROM in standing - unable to give handout as printer was not working properly, please provide handout at next visit.   Person(s) Educated Patient   Methods Explanation;Demonstration   Comprehension Verbalized understanding;Returned demonstration          OT Short Term Goals - 12/24/15 1546      OT SHORT TERM GOAL #1   Title Patient will be educated and independent with HEP to increase functional use of LUE during daily and work related tasks.    Time 4   Period Weeks   Status On-going     OT SHORT TERM GOAL #2   Title Patient will return to highest level of independence during daily and work related tasks.    Time 4   Period Weeks   Status On-going     OT SHORT TERM GOAL #3   Title Patient will decrease pain level to 2/10 or less in Left arm when reaching out to the side or overhead.   Time 4   Period Weeks   Status On-going     OT SHORT TERM GOAL #4   Title Patient will increase left shoulder strength and  stability to a 5/5 to increase ability to complete work tasks with less pain.   Time 4   Period Weeks   Status On-going     OT SHORT TERM GOAL #5   Title Patient will decrease fascial restrictions to min amount or less to increase functional mobility  of LUE.   Time 4   Period Weeks   Status Achieved     OT SHORT TERM GOAL #6   Title Patient will increse A/ROM to WNL to increase ability to dry back with towel.   Time 4   Period Weeks   Status On-going                  Plan - 12/24/15 1543    Clinical Impression Statement A:  Patient has significant improvements in A/ROM and strength.  He continues to experience pain at the end of range with reaching overhead or behind his head.  Therapist encouraged patient to continue functional use of his left arm with activities to improve pain free range.  Patient requires cuing for correct form with scapular stability exercises.    Rehab Potential Good   OT Frequency 3x / week   OT Duration 4 weeks   OT Treatment/Interventions Self-care/ADL training;Therapeutic exercise;Patient/family education;Manual Therapy;Ultrasound;Cryotherapy;Electrical Stimulation;Moist Heat;Passive range of motion;DME and/or AE instruction;Therapeutic activities   Plan P:  issue handout of A/ROM exercises, continue to focus on end range reaching, scapular stability and decreasing pain in order to return to work.    OT Home Exercise Plan 10/25: AA/ROM exercises. 11/9: scapular theraband (red), 12/24/15 A/ROM   Consulted and Agree with Plan of Care Patient      Patient will benefit from skilled therapeutic intervention in order to improve the following deficits and impairments:  Decreased strength, Decreased range of motion, Pain, Impaired UE functional use, Increased fascial restricitons  Visit Diagnosis: Other symptoms and signs involving the musculoskeletal system - Plan: Ot plan of care cert/re-cert  Stiffness of left shoulder, not elsewhere classified -  Plan: Ot plan of care cert/re-cert  Acute pain of left shoulder - Plan: Ot plan of care cert/re-cert    Problem List Patient Active Problem List   Diagnosis Date Noted  . Bursitis of left shoulder   . Tendinitis of left shoulder   . Pernicious anemia 04/18/2013  . Prostate cancer ([place]) 11/10/2012  . PIGMENTED VILLONODULAR SYNOVITIS 12/21/2006    Vangie Bicker, [place], OTR/L [phone]  12/24/2015, 3:49 PM  [place], [place], [address] Phone: [phone]   Fax:  [phone]  Name: Kenneth Fritz MRN: [id number] Date of Birth: August 13, 1955

## 2015-12-25 ENCOUNTER — Encounter: Payer: Self-pay | Admitting: Orthopedic Surgery

## 2015-12-25 ENCOUNTER — Ambulatory Visit (INDEPENDENT_AMBULATORY_CARE_PROVIDER_SITE_OTHER): Payer: BLUE CROSS/BLUE SHIELD | Admitting: Orthopedic Surgery

## 2015-12-25 DIAGNOSIS — Z9889 Other specified postprocedural states: Secondary | ICD-10-CM

## 2015-12-25 DIAGNOSIS — Z4889 Encounter for other specified surgical aftercare: Secondary | ICD-10-CM

## 2015-12-25 NOTE — Progress Notes (Signed)
Patient ID: Kenneth Fritz, male   DOB: 03-Oct-1955, 60 y.o.   MRN: [id number]  Post op visit   Chief Complaint  Patient presents with  . Follow-up    post op, SALS, DOS 11/29/15    Status post arthroscopic subacromial decompression debridement left shoulder  Patient complains of pain at terminal flexion and abduction  Is tender the rotator interval  He's regained 90% of his range of motion  He is to continue with exercises and return in 2 weeks  His work note runs out on the 24th we will extend that out to proximal 0.8 weeks post surgery  Encounter Diagnoses  Name Primary?  . S/P arthroscopy of shoulder   . Aftercare following surgery Yes

## 2015-12-26 ENCOUNTER — Encounter: Payer: Self-pay | Admitting: Orthopedic Surgery

## 2015-12-26 ENCOUNTER — Ambulatory Visit (HOSPITAL_COMMUNITY): Payer: BLUE CROSS/BLUE SHIELD | Admitting: Occupational Therapy

## 2015-12-26 ENCOUNTER — Encounter (HOSPITAL_COMMUNITY): Payer: Self-pay | Admitting: Occupational Therapy

## 2015-12-26 DIAGNOSIS — R29898 Other symptoms and signs involving the musculoskeletal system: Secondary | ICD-10-CM

## 2015-12-26 DIAGNOSIS — M25612 Stiffness of left shoulder, not elsewhere classified: Secondary | ICD-10-CM

## 2015-12-26 DIAGNOSIS — M25512 Pain in left shoulder: Secondary | ICD-10-CM

## 2015-12-26 NOTE — Therapy (Signed)
Kenneth Fritz, [place], [address] Phone: [phone]   Fax:  [phone]  Occupational Therapy Treatment  Patient Details  Name: Kenneth Fritz MRN: [id number] Date of Birth: 1955-03-03 Referring Provider: Arther Abbott, MD  Encounter Date: 12/26/2015      OT End of Session - 12/26/15 1216    Visit Number 9   Number of Visits 12   Date for OT Re-Evaluation 01/23/16   Authorization Type BCBS   Authorization Time Period Approved for 80 visits combined with PT (02/11/15-02/10/16). 0 used this year. 9 used this year.    Authorization - Visit Number 18   Authorization - Number of Visits 80   OT Start Time [id number]   OT Stop Time 1112   OT Time Calculation (min) 40 min   Activity Tolerance Patient tolerated treatment well   Behavior During Therapy WFL for tasks assessed/performed      Past Medical History:  Diagnosis Date  . Cancer ([facility]) 11/11   Prostate  . Pernicious anemia     Past Surgical History:  Procedure Laterality Date  . CHOLECYSTECTOMY    . KNEE ARTHROSCOPY Left   . SHOULDER ARTHROSCOPY WITH SUBACROMIAL DECOMPRESSION Left 11/29/2015   Procedure: SHOULDER ARTHROSCOPY WITH SUBACROMIAL DECOMPRESSION;  Surgeon: Carole Civil, MD;  Location: [facility];  Service: Orthopedics;  Laterality: Left;  AND DEBRIDEMENT Pt notified new arrival time 9:45am per KF 10/16  . XI ROBOTIC ASSISTED SIMPLE PROSTATECTOMY     [name]    There were no vitals filed for this visit.      Subjective Assessment - 12/26/15 1032    Subjective  S: The doctor said to do therapy through next week then do home exercises.    Currently in Pain? No/denies            [facility] OT Assessment - 12/26/15 1032      Assessment   Diagnosis Left shoulder arthroscopy     Precautions   Precautions Other (comment)   Precaution Comments No lifting over 5 lbs. per patient. Progress as tolerated. with progressive resistive exercises.                    OT Treatments/Exercises (OP) - 12/26/15 1035      Exercises   Exercises Shoulder     Shoulder Exercises: Supine   Protraction PROM;5 reps;AROM;15 reps   Horizontal ABduction PROM;5 reps;AROM;15 reps   External Rotation PROM;5 reps;AROM;15 reps   Internal Rotation PROM;5 reps;AROM;15 reps   Flexion PROM;5 reps;AROM;15 reps   ABduction PROM;5 reps;AROM;15 reps     Shoulder Exercises: Sidelying   External Rotation Strengthening;10 reps   External Rotation Weight (lbs) 1   Internal Rotation Strengthening;10 reps   Internal Rotation Weight (lbs) 1   Flexion Strengthening;10 reps   Flexion Weight (lbs) 1   ABduction Strengthening;10 reps   ABduction Weight (lbs) 1   Other Sidelying Exercises protraction and horizontal abduction with 1# resist 10 times      Shoulder Exercises: Standing   Protraction AROM;15 reps   Horizontal ABduction AROM;15 reps   External Rotation AROM;Theraband;15 reps   Theraband Level (Shoulder External Rotation) Level 2 (Red)   Internal Rotation AROM;Theraband;15 reps   Theraband Level (Shoulder Internal Rotation) Level 2 (Red)   Flexion AROM;15 reps   ABduction AROM;15 reps   Extension Theraband;15 reps   Theraband Level (Shoulder Extension) Level 3 (Green)   Row Theraband;15 reps   Theraband Level (Shoulder  Row) Level 3 (Green)   Retraction Theraband;15 reps   Theraband Level (Shoulder Retraction) Level 3 (Green)     Shoulder Exercises: ROM/Strengthening   X to V Arms 10X   Proximal Shoulder Strengthening, Supine 15X each no rest breaks   Ball on Wall 1' flexion   Other ROM/Strengthening Exercises standing field goal, 10X, A/ROM     Shoulder Exercises: Stretch   Internal Rotation Stretch 3 reps  10 seconds   External Rotation Stretch 3 reps;10 seconds     Manual Therapy   Manual Therapy Myofascial release   Manual therapy comments Manual therapy completed prior to exercises.   Myofascial Release Myofascial release  and manual stretching completed to left upper arm, trapezius, and scaplaris region to decrease fascial restrictions and increase joint mobility in a pain free zone.                 OT Education - 12/26/15 1112    Education provided Yes   Education Details A/ROM handout   Person(s) Educated Patient   Methods Explanation;Demonstration;Handout   Comprehension Verbalized understanding;Returned demonstration          OT Short Term Goals - 12/24/15 1546      OT SHORT TERM GOAL #1   Title Patient will be educated and independent with HEP to increase functional use of LUE during daily and work related tasks.    Time 4   Period Weeks   Status On-going     OT SHORT TERM GOAL #2   Title Patient will return to highest level of independence during daily and work related tasks.    Time 4   Period Weeks   Status On-going     OT SHORT TERM GOAL #3   Title Patient will decrease pain level to 2/10 or less in Left arm when reaching out to the side or overhead.   Time 4   Period Weeks   Status On-going     OT SHORT TERM GOAL #4   Title Patient will increase left shoulder strength and stability to a 5/5 to increase ability to complete work tasks with less pain.   Time 4   Period Weeks   Status On-going     OT SHORT TERM GOAL #5   Title Patient will decrease fascial restrictions to min amount or less to increase functional mobility of LUE.   Time 4   Period Weeks   Status Achieved     OT SHORT TERM GOAL #6   Title Patient will increse A/ROM to WNL to increase ability to dry back with towel.   Time 4   Period Weeks   Status On-going                  Plan - 12/26/15 1216    Clinical Impression Statement A: Pt reports no pain this session, slight discomfort at end range of motion. Pt reports MD would like him to continue therapy through next week and then complete HEP. Completed shoulder stretches with improved form, provided A/ROM HEP.    Plan P: Add 1# weight to  supine exercises, continue with scapular strenthening, add ball on wall in abduction.    OT Home Exercise Plan 10/25: AA/ROM exercises. 11/9: scapular theraband (red), 12/24/15 A/ROM   Consulted and Agree with Plan of Care Patient      Patient will benefit from skilled therapeutic intervention in order to improve the following deficits and impairments:  Decreased strength, Decreased range of motion, Pain, Impaired  UE functional use, Increased fascial restricitons  Visit Diagnosis: Other symptoms and signs involving the musculoskeletal system  Stiffness of left shoulder, not elsewhere classified  Acute pain of left shoulder    Problem List Patient Active Problem List   Diagnosis Date Noted  . Bursitis of left shoulder   . Tendinitis of left shoulder   . Pernicious anemia 04/18/2013  . Prostate cancer ([place]) 11/10/2012  . 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

## 2015-12-26 NOTE — Patient Instructions (Signed)

## 2015-12-28 ENCOUNTER — Ambulatory Visit (HOSPITAL_COMMUNITY): Payer: BLUE CROSS/BLUE SHIELD | Admitting: Occupational Therapy

## 2015-12-31 ENCOUNTER — Ambulatory Visit (HOSPITAL_COMMUNITY): Payer: BLUE CROSS/BLUE SHIELD | Admitting: Specialist

## 2015-12-31 DIAGNOSIS — R29898 Other symptoms and signs involving the musculoskeletal system: Secondary | ICD-10-CM

## 2015-12-31 DIAGNOSIS — M25512 Pain in left shoulder: Secondary | ICD-10-CM

## 2015-12-31 DIAGNOSIS — M25612 Stiffness of left shoulder, not elsewhere classified: Secondary | ICD-10-CM

## 2015-12-31 NOTE — Therapy (Signed)
[place], [place], [address] Phone: [phone]   Fax:  [phone]  Occupational Therapy Treatment  Patient Details  Name: Kenneth Fritz MRN: [id number] Date of Birth: [birth year] Referring Provider: Arther Abbott, MD  Encounter Date: 12/31/2015      OT End of Session - 12/31/15 1110    Visit Number 10   Number of Visits 12   Date for OT Re-Evaluation 01/23/16   Authorization Type BCBS   Authorization Time Period Approved for 80 visits combined with PT (02/11/15-02/10/16). 0 used this year. 9 used this year.    Authorization - Visit Number 19   Authorization - Number of Visits 7   OT Start Time [id number]   OT Stop Time 1115   OT Time Calculation (min) 36 min   Activity Tolerance Patient tolerated treatment well   Behavior During Therapy WFL for tasks assessed/performed      Past Medical History:  Diagnosis Date  . Cancer ([place]) 11/11   Prostate  . Pernicious anemia     Past Surgical History:  Procedure Laterality Date  . CHOLECYSTECTOMY    . KNEE ARTHROSCOPY Left   . SHOULDER ARTHROSCOPY WITH SUBACROMIAL DECOMPRESSION Left 11/29/2015   Procedure: SHOULDER ARTHROSCOPY WITH SUBACROMIAL DECOMPRESSION;  Surgeon: Carole Civil, MD;  Location: [facility];  Service: Orthopedics;  Laterality: Left;  AND DEBRIDEMENT Pt notified new arrival time 9:45am per KF 10/16  . XI ROBOTIC ASSISTED SIMPLE PROSTATECTOMY     [name]    There were no vitals filed for this visit.      Subjective Assessment - 12/31/15 1040    Subjective  S:  I think I sleep on it wrong.    Currently in Pain? Yes   Pain Score 2    Pain Location Shoulder   Pain Orientation Left   Pain Descriptors / Indicators Aching            [facility] OT Assessment - 12/31/15 0001      Assessment   Diagnosis Left shoulder arthroscopy     Precautions   Precautions Other (comment)   Precaution Comments No lifting over 5 lbs. per patient.  Progress as tolerated. with progressive resistive exercises.                   OT Treatments/Exercises (OP) - 12/31/15 0001      Exercises   Exercises Shoulder     Shoulder Exercises: Supine   Protraction PROM;5 reps;Strengthening;10 reps   Protraction Weight (lbs) 2   Horizontal ABduction PROM;5 reps;Strengthening;10 reps   Horizontal ABduction Weight (lbs) 2   External Rotation PROM;5 reps;Strengthening;10 reps   External Rotation Weight (lbs) 2   Internal Rotation PROM;5 reps;Strengthening;10 reps   Internal Rotation Weight (lbs) 2   Flexion PROM;5 reps;Strengthening;10 reps   Shoulder Flexion Weight (lbs) 2   ABduction PROM;5 reps;Strengthening;10 reps   Shoulder ABduction Weight (lbs) 2     Shoulder Exercises: Standing   Protraction Strengthening;10 reps   Protraction Weight (lbs) 2   Horizontal ABduction Strengthening;10 reps   Horizontal ABduction Weight (lbs) 2   External Rotation Strengthening;10 reps;Theraband;15 reps   Theraband Level (Shoulder External Rotation) Level 3 (Green)   External Rotation Weight (lbs) 2   Internal Rotation Strengthening;10 reps;Theraband;15 reps   Theraband Level (Shoulder Internal Rotation) Level 3 (Green)   Internal Rotation Weight (lbs) 2   Flexion Strengthening;10 reps   Shoulder Flexion Weight (lbs) 2   ABduction Strengthening;10  reps   Shoulder ABduction Weight (lbs) 2   Extension Theraband;15 reps   Theraband Level (Shoulder Extension) Level 3 (Green)   Row [employer] reps   Theraband Level (Shoulder Row) Level 3 (Green)   Retraction Theraband;15 reps   Theraband Level (Shoulder Retraction) Level 3 (Green)     Shoulder Exercises: ROM/Strengthening   UBE (Upper Arm Bike) level 2 3' forward and 3' reverse    "W" Arms 10 X with 2#   X to V Arms 10 times with2#   Proximal Shoulder Strengthening, Supine 10 times with 2# resistance without resting   Proximal Shoulder Strengthening, Seated 10 times with 2# without  resting     Manual Therapy   Manual Therapy Myofascial release   Manual therapy comments Manual therapy completed prior to exercises.   Myofascial Release Myofascial release and manual stretching completed to left upper arm, trapezius, and scaplaris region to decrease fascial restrictions and increase joint mobility in a pain free zone.                   OT Short Term Goals - 12/24/15 1546      OT SHORT TERM GOAL #1   Title Patient will be educated and independent with HEP to increase functional use of LUE during daily and work related tasks.    Time 4   Period Weeks   Status On-going     OT SHORT TERM GOAL #2   Title Patient will return to highest level of independence during daily and work related tasks.    Time 4   Period Weeks   Status On-going     OT SHORT TERM GOAL #3   Title Patient will decrease pain level to 2/10 or less in Left arm when reaching out to the side or overhead.   Time 4   Period Weeks   Status On-going     OT SHORT TERM GOAL #4   Title Patient will increase left shoulder strength and stability to a 5/5 to increase ability to complete work tasks with less pain.   Time 4   Period Weeks   Status On-going     OT SHORT TERM GOAL #5   Title Patient will decrease fascial restrictions to min amount or less to increase functional mobility of LUE.   Time 4   Period Weeks   Status Achieved     OT SHORT TERM GOAL #6   Title Patient will increse A/ROM to WNL to increase ability to dry back with towel.   Time 4   Period Weeks   Status On-going                  Plan - 12/31/15 1111    Clinical Impression Statement A:  Increased to strengthening this date with 2# resist, min verbal guidance required for form and technique.     Plan P:  Reassess for MD visit, determine if patient would benefit from continued therapy or transtion to HEP.        Patient will benefit from skilled therapeutic intervention in order to improve the following  deficits and impairments:     Visit Diagnosis: Other symptoms and signs involving the musculoskeletal system  Stiffness of left shoulder, not elsewhere classified  Acute pain of left shoulder    Problem List Patient Active Problem List   Diagnosis Date Noted  . Bursitis of left shoulder   . Tendinitis of left shoulder   . Pernicious anemia 04/18/2013  .  Prostate cancer ([place]) 11/10/2012  . 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

## 2016-01-02 ENCOUNTER — Encounter (HOSPITAL_COMMUNITY): Payer: Self-pay | Admitting: Occupational Therapy

## 2016-01-02 ENCOUNTER — Ambulatory Visit (HOSPITAL_COMMUNITY): Payer: BLUE CROSS/BLUE SHIELD | Admitting: Occupational Therapy

## 2016-01-02 DIAGNOSIS — R29898 Other symptoms and signs involving the musculoskeletal system: Secondary | ICD-10-CM | POA: Diagnosis not present

## 2016-01-02 DIAGNOSIS — M25512 Pain in left shoulder: Secondary | ICD-10-CM

## 2016-01-02 DIAGNOSIS — M25612 Stiffness of left shoulder, not elsewhere classified: Secondary | ICD-10-CM

## 2016-01-02 NOTE — Therapy (Signed)
[place], [place], [address] Phone: [phone]   Fax:  [phone]  Occupational Therapy Reassessment, Treatment, Discharge  Patient Details  Name: Kenneth Fritz MRN: [id number] Date of Birth: [birth year] Referring Provider: Arther Abbott, MD  Encounter Date: 01/02/2016      OT End of Session - 01/02/16 1021    Visit Number 11   Number of Visits 12   Date for OT Re-Evaluation 01/23/16   Authorization Type BCBS   Authorization Time Period Approved for 80 visits combined with PT (02/11/15-02/10/16). 0 used this year. 9 used this year.    Authorization - Visit Number 20   Authorization - Number of Visits 40   OT Start Time 0945   OT Stop Time 1020   OT Time Calculation (min) 35 min   Activity Tolerance Patient tolerated treatment well   Behavior During Therapy WFL for tasks assessed/performed      Past Medical History:  Diagnosis Date  . Cancer ([place]) 11/11   Prostate  . Pernicious anemia     Past Surgical History:  Procedure Laterality Date  . CHOLECYSTECTOMY    . KNEE ARTHROSCOPY Left   . SHOULDER ARTHROSCOPY WITH SUBACROMIAL DECOMPRESSION Left 11/29/2015   Procedure: SHOULDER ARTHROSCOPY WITH SUBACROMIAL DECOMPRESSION;  Surgeon: Carole Civil, MD;  Location: [facility];  Service: Orthopedics;  Laterality: Left;  AND DEBRIDEMENT Pt notified new arrival time 9:45am per KF 10/16  . XI ROBOTIC ASSISTED SIMPLE PROSTATECTOMY     [name]    There were no vitals filed for this visit.      Subjective Assessment - 01/02/16 0945    Subjective  S: The pain with dressing is just an ache now.    Currently in Pain? No/denies           [facility] OT Assessment - 01/02/16 0945      Assessment   Diagnosis Left shoulder arthroscopy     Precautions   Precautions Other (comment)   Precaution Comments No lifting over 5 lbs. per patient. Progress as tolerated. with progressive resistive exercises.       Palpation   Palpation comment minimal fascial restrictions noted in left shoulder region.      AROM   Overall AROM Comments assessed in seated IR/er adducted  (initial evaluation)   AROM Assessment Site Shoulder   Right/Left Shoulder Left   Left Shoulder Flexion 150 Degrees  same as previous   Left Shoulder ABduction 161 Degrees  160 previous   Left Shoulder Internal Rotation 90 Degrees  same as previous   Left Shoulder External Rotation 50 Degrees  same as previous     PROM   Overall PROM Comments Assessed supine. IR/er adducted   PROM Assessment Site Shoulder   Right/Left Shoulder Left   Left Shoulder Flexion 167 Degrees  160 previous   Left Shoulder ABduction 180 Degrees  175 previous   Left Shoulder Internal Rotation 90 Degrees  same as previous   Left Shoulder External Rotation 55 Degrees  same as previous     Strength   Overall Strength Comments Assessed seated. IR/er adducted   Strength Assessment Site Shoulder   Right/Left Shoulder Left   Left Shoulder Flexion 4+/5  same as previous   Left Shoulder ABduction 4+/5  same as previous   Left Shoulder Internal Rotation 4+/5  same as previous   Left Shoulder External Rotation 4+/5  same as previous  OT Treatments/Exercises (OP) - 01/02/16 0947      Exercises   Exercises Shoulder     Shoulder Exercises: Supine   Protraction PROM;5 reps   Horizontal ABduction PROM;5 reps   External Rotation PROM;5 reps   Internal Rotation PROM;5 reps   Flexion PROM;5 reps   ABduction PROM;5 reps     Shoulder Exercises: Standing   Protraction Strengthening;10 reps   Protraction Weight (lbs) 2   Horizontal ABduction Strengthening;10 reps   Horizontal ABduction Weight (lbs) 2   External Rotation Strengthening;10 reps   External Rotation Weight (lbs) 2   Internal Rotation Strengthening;10 reps   Internal Rotation Weight (lbs) 2   Flexion Strengthening;10 reps   Shoulder Flexion Weight (lbs) 2    ABduction Strengthening;10 reps   Shoulder ABduction Weight (lbs) 2   Extension Theraband;15 reps   Theraband Level (Shoulder Extension) Level 3 (Green)   Row [employer] reps   Theraband Level (Shoulder Row) Level 3 (Green)   Retraction Theraband;15 reps   Theraband Level (Shoulder Retraction) Level 3 (Green)     Shoulder Exercises: ROM/Strengthening   X to V Arms 10 times with2#   Proximal Shoulder Strengthening, Seated 10 times with 2# without resting   Other ROM/Strengthening Exercises Green theraband strengthening: horizontal abduction, er/IR, PNF patterns, 10X each     Manual Therapy   Manual Therapy Myofascial release   Manual therapy comments Manual therapy completed prior to exercises.   Myofascial Release Myofascial release and manual stretching completed to left upper arm, trapezius, and scaplaris region to decrease fascial restrictions and increase joint mobility in a pain free zone.                OT Education - 01/02/16 1006    Education provided Yes   Education Details green theraband strengthening   Person(s) Educated Patient   Methods Explanation;Demonstration;Handout   Comprehension Verbalized understanding;Returned demonstration          OT Short Term Goals - 01/02/16 1007      OT SHORT TERM GOAL #1   Title Patient will be educated and independent with HEP to increase functional use of LUE during daily and work related tasks.    Time 4   Period Weeks   Status Achieved     OT SHORT TERM GOAL #2   Title Patient will return to highest level of independence during daily and work related tasks.    Time 4   Period Weeks   Status Partially Met     OT SHORT TERM GOAL #3   Title Patient will decrease pain level to 2/10 or less in Left arm when reaching out to the side or overhead.   Time 4   Period Weeks   Status Achieved     OT SHORT TERM GOAL #4   Title Patient will increase left shoulder strength and stability to a 5/5 to increase ability  to complete work tasks with less pain.   Time 4   Period Weeks   Status Not Met     OT SHORT TERM GOAL #5   Title Patient will decrease fascial restrictions to min amount or less to increase functional mobility of LUE.   Time 4   Period Weeks   Status Achieved     OT SHORT TERM GOAL #6   Title Patient will increase A/ROM to WNL to increase ability to dry back with towel.   Time 4   Period Weeks   Status Partially Met  Plan - 01/02/16 1022    Clinical Impression Statement A: Reassessment completed this session, pt has met 3/6 goals and has partially met 2/6 goals. Pt has made improvements with ROM, strength, and functional use of the LUE during daily tasks. Pt reports he continues to have slight pain with donning shirts and reaching overhead, however pain has decreased since surgery and therapy. Pt is agreeable to discharge today with updated HEP.    Plan P: Discharge pt   OT Home Exercise Plan 10/25: AA/ROM exercises. 11/9: scapular theraband (red), 12/24/15 A/ROM; 11/21: green theraband strengthening HEP, updated scapular theraband to green   Consulted and Agree with Plan of Care Patient      Patient will benefit from skilled therapeutic intervention in order to improve the following deficits and impairments:  Decreased strength, Decreased range of motion, Pain, Impaired UE functional use, Increased fascial restricitons  Visit Diagnosis: Other symptoms and signs involving the musculoskeletal system  Stiffness of left shoulder, not elsewhere classified  Acute pain of left shoulder    Problem List Patient Active Problem List   Diagnosis Date Noted  . Bursitis of left shoulder   . Tendinitis of left shoulder   . Pernicious anemia 04/18/2013  . Prostate cancer ([place]) 11/10/2012  . 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 Pt reports improvements in use of LUE with all ADL tasks, has made improvements in all areas of function including pain, ROM, strength, and functional task completion.    Remaining deficits: Pt continues to have slight pain with donning shirts and reaching overhead. Has difficulty with lifting heavy objects overhead.    Education / Equipment: Educated on [employer], broadcasting including green theraband strengthening exercises, and adding weights to A/ROM exercises.  Plan: Patient agrees to discharge.  Patient goals were partially met. Patient is being discharged due to being pleased with the current functional level.  ?????

## 2016-01-02 NOTE — Patient Instructions (Signed)
Strengthening: Chest Pull - Resisted   Hold Theraband in front of body with hands about shoulder width a part. Pull band a part and back together slowly. Repeat __10-15__ times. Complete __1__ set(s) per session.. Repeat __1__ session(s) per day.  http://orth.exer.us/926   Copyright  VHI. All rights reserved.   PNF Strengthening: Resisted   Standing with resistive band around each hand, bring right arm up and away, thumb back. Repeat _10-15___ times per set. Do __1__ sets per session. Do ___1_ sessions per day.         Resisted External Rotation: in Neutral - Bilateral   Sit or stand, tubing in both hands, elbows at sides, bent to 90, forearms forward. Pinch shoulder blades together and rotate forearms out. Keep elbows at sides. Repeat __10-15__ times per set. Do __1__ sets per session. Do ___1_ sessions per day.  http://orth.exer.us/966   Copyright  VHI. All rights reserved.   PNF Strengthening: Resisted   Standing, hold resistive band above head. Bring right arm down and out from side. Repeat _10-15___ times per set. Do _1___ sets per session. Do ___1_ sessions per day.  http://orth.exer.us/922   Copyright  VHI. All rights reserved.  

## 2016-01-08 ENCOUNTER — Ambulatory Visit (INDEPENDENT_AMBULATORY_CARE_PROVIDER_SITE_OTHER): Payer: BLUE CROSS/BLUE SHIELD | Admitting: Orthopedic Surgery

## 2016-01-08 ENCOUNTER — Encounter: Payer: Self-pay | Admitting: Orthopedic Surgery

## 2016-01-08 DIAGNOSIS — Z4889 Encounter for other specified surgical aftercare: Secondary | ICD-10-CM

## 2016-01-08 DIAGNOSIS — Z9889 Other specified postprocedural states: Secondary | ICD-10-CM | POA: Diagnosis not present

## 2016-01-08 DIAGNOSIS — M75102 Unspecified rotator cuff tear or rupture of left shoulder, not specified as traumatic: Secondary | ICD-10-CM

## 2016-01-08 NOTE — Patient Instructions (Signed)
RTW NOV 29TH

## 2016-01-08 NOTE — Progress Notes (Signed)
Patient ID: Kenneth Fritz, male   DOB: [birth year], 60 y.o.   MRN: [id number]  Post op visit   Chief Complaint  Patient presents with  . Follow-up    LEFT SHOULDER ARTHROSCOPY, DOS 11/29/15     POST OP 6 WEEKS   C/O PAIN AT TERMINAL EXTENSION And x-ray May internal rotation  His impingement sign was positive at 180 and T7 internal rotation so I gave him another cortisone injection and will follow-up in a month he returns to work tomorrow   Encounter Diagnoses  Name Primary?  . S/P arthroscopy of shoulder Yes  . Aftercare following surgery   . Rotator cuff syndrome of left shoulder     Procedure note the subacromial injection shoulder left   Verbal consent was obtained to inject the  Left   Shoulder  Timeout was completed to confirm the injection site is a subacromial space of the  left  shoulder  Medication used Depo-Medrol 40 mg and lidocaine 1% 3 cc  Anesthesia was provided by ethyl chloride  The injection was performed in the left  posterior subacromial space. After pinning the skin with alcohol and anesthetized the skin with ethyl chloride the subacromial space was injected using a 20-gauge needle. There were no complications  Sterile dressing was applied.

## 2016-01-30 ENCOUNTER — Encounter: Payer: Self-pay | Admitting: Family Medicine

## 2016-01-31 NOTE — Telephone Encounter (Signed)
I'm not quite sure with the basis for this question is. I am asking that you look into this further then discuss it with me next week. I am not sure if the patient is being charged a nurse visit with the B12 or and administration feet with the B12? I'm not sure what the billing related policy is. I will not be available regarding this issue until next week. Feel free to inform patient via my chart that we are looking into this.

## 2016-02-12 ENCOUNTER — Encounter: Payer: Self-pay | Admitting: Orthopedic Surgery

## 2016-02-12 ENCOUNTER — Ambulatory Visit (INDEPENDENT_AMBULATORY_CARE_PROVIDER_SITE_OTHER): Payer: 59 | Admitting: *Deleted

## 2016-02-12 ENCOUNTER — Ambulatory Visit (INDEPENDENT_AMBULATORY_CARE_PROVIDER_SITE_OTHER): Payer: 59 | Admitting: Orthopedic Surgery

## 2016-02-12 DIAGNOSIS — D649 Anemia, unspecified: Secondary | ICD-10-CM | POA: Diagnosis not present

## 2016-02-12 DIAGNOSIS — Z4889 Encounter for other specified surgical aftercare: Secondary | ICD-10-CM

## 2016-02-12 DIAGNOSIS — Z9889 Other specified postprocedural states: Secondary | ICD-10-CM

## 2016-02-12 MED ORDER — CYANOCOBALAMIN 1000 MCG/ML IJ SOLN
1000.0000 ug | Freq: Once | INTRAMUSCULAR | Status: AC
  Administered 2016-02-12: 1000 ug via INTRAMUSCULAR

## 2016-02-12 MED ORDER — INDOMETHACIN 25 MG PO CAPS: 25.0000 mg | ORAL_CAPSULE | Freq: Two times a day (BID) | ORAL | 0 refills | Status: DC

## 2016-02-12 NOTE — Patient Instructions (Signed)
Start Indocin one twice a day with food take for one month  Follow-up one month

## 2016-02-12 NOTE — Progress Notes (Signed)
Chief Complaint  Patient presents with  . Follow-up    LEFT SHOULDER ARTHROSCOPY, DOS 11/29/15    Status post left shoulder arthroscopy decompression and debridement with no relief.  Now 2 months out almost 3 months out  Still has pain when he is putting his clothes on or reaching behind him  Reexamination reveals no palpable tenderness but pain with the Hawkins maneuver. Rotator cuff strength normal  Recommend Indocin twice a day  Come back one month

## 2016-03-03 ENCOUNTER — Ambulatory Visit (INDEPENDENT_AMBULATORY_CARE_PROVIDER_SITE_OTHER): Payer: 59 | Admitting: Family Medicine

## 2016-03-03 ENCOUNTER — Encounter: Payer: Self-pay | Admitting: Family Medicine

## 2016-03-03 VITALS — BP 130/86 | Temp 98.6°F | Ht 68.0 in | Wt 160.8 lb

## 2016-03-03 DIAGNOSIS — J111 Influenza due to unidentified influenza virus with other respiratory manifestations: Secondary | ICD-10-CM

## 2016-03-03 NOTE — Progress Notes (Signed)
   Subjective:    Patient ID: Kenneth Fritz, male    DOB: 1955-06-30, 61 y.o.   MRN: [id number]  Cough  This is a new problem. The current episode started in the past 7 days. Associated symptoms include chills, a fever, headaches, myalgias, nasal congestion and a sore throat. Associated symptoms comments: Vomiting and diarrhea and abdominal pain. Treatments tried: nyquil.    Started Friday evening body aches fever chills diarrhea now with body aches fever and chills a settle down coughing some runny nose no vomiting or diarrhea  Review of Systems  Constitutional: Positive for chills, fatigue and fever.  HENT: Positive for sore throat.   Respiratory: Positive for cough.   Musculoskeletal: Positive for back pain and myalgias.  Neurological: Positive for headaches.   Patient does not appear toxic    Objective:   Physical Exam  Constitutional: He appears well-developed.  HENT:  Head: Normocephalic.  Mouth/Throat: Oropharynx is clear and moist. No oropharyngeal exudate.  Neck: Normal range of motion.  Cardiovascular: Normal rate, regular rhythm and normal heart sounds.   No murmur heard. Pulmonary/Chest: Effort normal and breath sounds normal. He has no wheezes.  Lymphadenopathy:    He has no cervical adenopathy.  Neurological: He exhibits normal muscle tone.  Skin: Skin is warm and dry.  Nursing note and vitals reviewed.         Assessment & Plan:  Influenza-the patient was diagnosed with influenza. Patient/family educated about the flu and warning signs to watch for. If difficulty breathing, severe neck pain and stiffness, cyanosis, disorientation, or progressive worsening then immediately get rechecked at that ER. If progressive symptoms be certain to be rechecked. Supportive measures such as Tylenol/ibuprofen was discussed. No aspirin use in children. And influenza home care instruction sheet was given. Warning signs were discussed with patient detail It is not necessary  for this patient to be on Tamiflu

## 2016-03-03 NOTE — Patient Instructions (Signed)

## 2016-03-05 ENCOUNTER — Telehealth: Payer: Self-pay | Admitting: *Deleted

## 2016-03-05 ENCOUNTER — Ambulatory Visit (INDEPENDENT_AMBULATORY_CARE_PROVIDER_SITE_OTHER): Payer: 59 | Admitting: Family Medicine

## 2016-03-05 ENCOUNTER — Encounter: Payer: Self-pay | Admitting: Family Medicine

## 2016-03-05 VITALS — BP 122/82 | Temp 97.9°F | Wt 159.0 lb

## 2016-03-05 DIAGNOSIS — J209 Acute bronchitis, unspecified: Secondary | ICD-10-CM

## 2016-03-05 DIAGNOSIS — J019 Acute sinusitis, unspecified: Secondary | ICD-10-CM

## 2016-03-05 DIAGNOSIS — B9689 Other specified bacterial agents as the cause of diseases classified elsewhere: Secondary | ICD-10-CM | POA: Diagnosis not present

## 2016-03-05 DIAGNOSIS — J111 Influenza due to unidentified influenza virus with other respiratory manifestations: Secondary | ICD-10-CM

## 2016-03-05 MED ORDER — AZITHROMYCIN 250 MG PO TABS: ORAL_TABLET | ORAL | 0 refills | Status: DC

## 2016-03-05 NOTE — Progress Notes (Signed)
   Subjective:    Patient ID: Kenneth Fritz, male    DOB: 1955-02-12, 61 y.o.   MRN: [id number]  HPI Patient in office today c/o cough, fever at night, headache, diarrhea, body aches. He states symptoms started 1 week ago.  He states Dr. Nicki Reaper saw him this past Monday and he is no better. Patient states he's increasing cough deep cough denies some any wheezing or shortness of breath. Was seen for the flu.  Review of Systems Patient relates intermittent "clammy feeling low-grade fevers some congestion cough no wheezing no vomiting states energy level subpar feels weak able to eat some and drink some.    Objective:   Physical Exam Does not appear toxic eardrums normal throat is normal neck no masses lungs clear no crackles heart is regular abdomen soft extremities no edema       Assessment & Plan:  Viral syndrome Flu Secondary rhinosinusitis Secondary bronchitis I don't feel the patient needs x-rays or lab work currently he was warned regarding warning symptoms Zithromax as directed follow-up if progressive troubles

## 2016-03-05 NOTE — Telephone Encounter (Signed)
Discussed with pt. Pt verbalized understanding. Pt transferred to front to schedule office visit today.

## 2016-03-05 NOTE — Telephone Encounter (Signed)
Please connect with patient. Although the flu could still be causing the symptoms because this years flu is a whole lot worse and many people are getting pneumonia as a result of the flu I would recommend that we see him back in relisten to the lungs he may or may not need to be placed on an antibiotic if any signs of secondary infection. It is better to be safe than sorry

## 2016-03-11 ENCOUNTER — Ambulatory Visit (INDEPENDENT_AMBULATORY_CARE_PROVIDER_SITE_OTHER): Payer: 59 | Admitting: Orthopedic Surgery

## 2016-03-11 ENCOUNTER — Encounter: Payer: Self-pay | Admitting: Orthopedic Surgery

## 2016-03-11 DIAGNOSIS — Z9889 Other specified postprocedural states: Secondary | ICD-10-CM

## 2016-03-11 DIAGNOSIS — M75102 Unspecified rotator cuff tear or rupture of left shoulder, not specified as traumatic: Secondary | ICD-10-CM

## 2016-03-11 MED ORDER — INDOMETHACIN 25 MG PO CAPS: 25.0000 mg | ORAL_CAPSULE | Freq: Two times a day (BID) | ORAL | 0 refills | Status: DC

## 2016-03-11 NOTE — Patient Instructions (Signed)
Continue Indocin  Call us if no improvement after 3 months

## 2016-03-11 NOTE — Progress Notes (Signed)
Chief Complaint  Patient presents with  . Follow-up    SALS, DOS 11/29/15   Continued pain after subacromial decompression debridement left shoulder he did get some benefit with Indocin but still has pain at night and pain with extremes of forward elevation of the shoulder  Impingement sign is positive on exam today. No tenderness or pain with palpation around the shoulder joint including rotator interval lateral deltoid anterior deltoid posterior subacromial space  Rotator CUFF trength was normal to manual muscle testing  He declined injection wants to try more time and Indocin as needed  Follow-up if needed in 3 months

## 2016-03-26 ENCOUNTER — Ambulatory Visit (INDEPENDENT_AMBULATORY_CARE_PROVIDER_SITE_OTHER): Payer: 59 | Admitting: *Deleted

## 2016-03-26 DIAGNOSIS — D510 Vitamin B12 deficiency anemia due to intrinsic factor deficiency: Secondary | ICD-10-CM

## 2016-03-26 MED ORDER — CYANOCOBALAMIN 1000 MCG/ML IJ SOLN
1000.0000 ug | Freq: Once | INTRAMUSCULAR | Status: AC
  Administered 2016-03-26: 1000 ug via INTRAMUSCULAR

## 2016-04-16 ENCOUNTER — Ambulatory Visit: Payer: 59

## 2016-04-23 ENCOUNTER — Ambulatory Visit: Payer: 59

## 2016-04-24 ENCOUNTER — Ambulatory Visit (INDEPENDENT_AMBULATORY_CARE_PROVIDER_SITE_OTHER): Payer: 59 | Admitting: *Deleted

## 2016-04-24 DIAGNOSIS — D510 Vitamin B12 deficiency anemia due to intrinsic factor deficiency: Secondary | ICD-10-CM

## 2016-04-24 MED ORDER — CYANOCOBALAMIN 1000 MCG/ML IJ SOLN
1000.0000 ug | Freq: Once | INTRAMUSCULAR | Status: AC
  Administered 2016-04-24: 1000 ug via INTRAMUSCULAR

## 2016-07-08 ENCOUNTER — Ambulatory Visit (INDEPENDENT_AMBULATORY_CARE_PROVIDER_SITE_OTHER): Payer: 59

## 2016-07-08 DIAGNOSIS — D510 Vitamin B12 deficiency anemia due to intrinsic factor deficiency: Secondary | ICD-10-CM

## 2016-07-08 MED ORDER — CYANOCOBALAMIN 1000 MCG/ML IJ SOLN
1000.0000 ug | Freq: Once | INTRAMUSCULAR | Status: AC
  Administered 2016-07-08: 1000 ug via INTRAMUSCULAR

## 2016-09-16 ENCOUNTER — Ambulatory Visit (INDEPENDENT_AMBULATORY_CARE_PROVIDER_SITE_OTHER): Payer: 59 | Admitting: *Deleted

## 2016-09-16 DIAGNOSIS — D649 Anemia, unspecified: Secondary | ICD-10-CM

## 2016-09-16 MED ORDER — CYANOCOBALAMIN 1000 MCG/ML IJ SOLN
1000.0000 ug | Freq: Once | INTRAMUSCULAR | Status: AC
  Administered 2016-09-16: 1000 ug via INTRAMUSCULAR

## 2016-12-04 ENCOUNTER — Ambulatory Visit (INDEPENDENT_AMBULATORY_CARE_PROVIDER_SITE_OTHER): Payer: 59

## 2016-12-04 DIAGNOSIS — D510 Vitamin B12 deficiency anemia due to intrinsic factor deficiency: Secondary | ICD-10-CM | POA: Diagnosis not present

## 2016-12-04 MED ORDER — CYANOCOBALAMIN 1000 MCG/ML IJ SOLN
1000.0000 ug | Freq: Once | INTRAMUSCULAR | Status: AC
  Administered 2016-12-04: 1000 ug via INTRAMUSCULAR

## 2017-01-07 ENCOUNTER — Ambulatory Visit (INDEPENDENT_AMBULATORY_CARE_PROVIDER_SITE_OTHER): Payer: 59 | Admitting: *Deleted

## 2017-01-07 DIAGNOSIS — D649 Anemia, unspecified: Secondary | ICD-10-CM

## 2017-01-07 DIAGNOSIS — Z23 Encounter for immunization: Secondary | ICD-10-CM | POA: Diagnosis not present

## 2017-01-07 MED ORDER — CYANOCOBALAMIN 1000 MCG/ML IJ SOLN
1000.0000 ug | Freq: Once | INTRAMUSCULAR | Status: AC
  Administered 2017-01-07: 1000 ug via INTRAMUSCULAR

## 2017-04-22 ENCOUNTER — Ambulatory Visit (INDEPENDENT_AMBULATORY_CARE_PROVIDER_SITE_OTHER): Payer: 59

## 2017-04-22 DIAGNOSIS — D510 Vitamin B12 deficiency anemia due to intrinsic factor deficiency: Secondary | ICD-10-CM

## 2017-04-22 MED ORDER — CYANOCOBALAMIN 1000 MCG/ML IJ SOLN
1000.0000 ug | Freq: Once | INTRAMUSCULAR | Status: AC
  Administered 2017-04-22: 1000 ug via INTRAMUSCULAR

## 2017-06-17 ENCOUNTER — Ambulatory Visit (INDEPENDENT_AMBULATORY_CARE_PROVIDER_SITE_OTHER): Payer: 59

## 2017-06-17 DIAGNOSIS — D510 Vitamin B12 deficiency anemia due to intrinsic factor deficiency: Secondary | ICD-10-CM | POA: Diagnosis not present

## 2017-06-17 MED ORDER — CYANOCOBALAMIN 1000 MCG/ML IJ SOLN
1000.0000 ug | Freq: Once | INTRAMUSCULAR | Status: AC
  Administered 2017-06-17: 1000 ug via INTRAMUSCULAR

## 2017-08-06 ENCOUNTER — Ambulatory Visit (INDEPENDENT_AMBULATORY_CARE_PROVIDER_SITE_OTHER): Payer: 59 | Admitting: *Deleted

## 2017-08-06 DIAGNOSIS — D510 Vitamin B12 deficiency anemia due to intrinsic factor deficiency: Secondary | ICD-10-CM | POA: Diagnosis not present

## 2017-08-06 MED ORDER — CYANOCOBALAMIN 1000 MCG/ML IJ SOLN
1000.0000 ug | Freq: Once | INTRAMUSCULAR | Status: AC
  Administered 2017-08-06: 1000 ug via INTRAMUSCULAR

## 2017-10-14 ENCOUNTER — Ambulatory Visit (INDEPENDENT_AMBULATORY_CARE_PROVIDER_SITE_OTHER): Payer: 59 | Admitting: *Deleted

## 2017-10-14 DIAGNOSIS — D510 Vitamin B12 deficiency anemia due to intrinsic factor deficiency: Secondary | ICD-10-CM

## 2017-10-14 MED ORDER — CYANOCOBALAMIN 1000 MCG/ML IJ SOLN
1000.0000 ug | Freq: Once | INTRAMUSCULAR | Status: AC
  Administered 2017-10-14: 1000 ug via INTRAMUSCULAR

## 2017-12-31 ENCOUNTER — Ambulatory Visit (INDEPENDENT_AMBULATORY_CARE_PROVIDER_SITE_OTHER): Payer: 59

## 2017-12-31 DIAGNOSIS — D510 Vitamin B12 deficiency anemia due to intrinsic factor deficiency: Secondary | ICD-10-CM | POA: Diagnosis not present

## 2017-12-31 DIAGNOSIS — Z23 Encounter for immunization: Secondary | ICD-10-CM

## 2017-12-31 MED ORDER — CYANOCOBALAMIN 1000 MCG/ML IJ SOLN
1000.0000 ug | Freq: Once | INTRAMUSCULAR | Status: AC
  Administered 2017-12-31: 1000 ug via INTRAMUSCULAR

## 2017-12-31 MED ORDER — CYANOCOBALAMIN 1000 MCG/ML IJ SOLN: 1000.0000 ug | Freq: Once | INTRAMUSCULAR | 0 refills | Status: DC

## 2018-03-02 ENCOUNTER — Ambulatory Visit (INDEPENDENT_AMBULATORY_CARE_PROVIDER_SITE_OTHER): Payer: 59 | Admitting: *Deleted

## 2018-03-02 DIAGNOSIS — D510 Vitamin B12 deficiency anemia due to intrinsic factor deficiency: Secondary | ICD-10-CM | POA: Diagnosis not present

## 2018-03-02 DIAGNOSIS — Z0000 Encounter for general adult medical examination without abnormal findings: Secondary | ICD-10-CM

## 2018-03-02 DIAGNOSIS — Z114 Encounter for screening for human immunodeficiency virus [HIV]: Secondary | ICD-10-CM

## 2018-03-02 DIAGNOSIS — Z1159 Encounter for screening for other viral diseases: Secondary | ICD-10-CM

## 2018-03-02 DIAGNOSIS — Z13220 Encounter for screening for lipoid disorders: Secondary | ICD-10-CM

## 2018-03-02 MED ORDER — CYANOCOBALAMIN 1000 MCG/ML IJ SOLN
1000.0000 ug | Freq: Once | INTRAMUSCULAR | Status: AC
  Administered 2018-03-02: 1000 ug via INTRAMUSCULAR

## 2018-04-05 DIAGNOSIS — Z13220 Encounter for screening for lipoid disorders: Secondary | ICD-10-CM | POA: Diagnosis not present

## 2018-04-05 DIAGNOSIS — Z0000 Encounter for general adult medical examination without abnormal findings: Secondary | ICD-10-CM | POA: Diagnosis not present

## 2018-04-05 DIAGNOSIS — Z1159 Encounter for screening for other viral diseases: Secondary | ICD-10-CM | POA: Diagnosis not present

## 2018-04-05 DIAGNOSIS — D510 Vitamin B12 deficiency anemia due to intrinsic factor deficiency: Secondary | ICD-10-CM | POA: Diagnosis not present

## 2018-04-06 LAB — CBC WITH DIFFERENTIAL/PLATELET
BASOS ABS: 0 10*3/uL (ref 0.0–0.2)
Basos: 1 %
EOS (ABSOLUTE): 0.1 10*3/uL (ref 0.0–0.4)
Eos: 2 %
Hematocrit: 45.6 % (ref 37.5–51.0)
Hemoglobin: 15.6 g/dL (ref 13.0–17.7)
Immature Grans (Abs): 0 10*3/uL (ref 0.0–0.1)
Immature Granulocytes: 0 %
Lymphocytes Absolute: 1.3 10*3/uL (ref 0.7–3.1)
Lymphs: 23 %
MCH: 30.8 pg (ref 26.6–33.0)
MCHC: 34.2 g/dL (ref 31.5–35.7)
MCV: 90 fL (ref 79–97)
Monocytes Absolute: 0.5 10*3/uL (ref 0.1–0.9)
Monocytes: 9 %
Neutrophils Absolute: 3.9 10*3/uL (ref 1.4–7.0)
Neutrophils: 65 %
Platelets: 321 10*3/uL (ref 150–450)
RBC: 5.07 x10E6/uL (ref 4.14–5.80)
RDW: 13.2 % (ref 11.6–15.4)
WBC: 5.9 10*3/uL (ref 3.4–10.8)

## 2018-04-06 LAB — HIV ANTIBODY (ROUTINE TESTING W REFLEX): HIV Screen 4th Generation wRfx: NONREACTIVE

## 2018-04-06 LAB — LIPID PANEL
CHOLESTEROL TOTAL: 179 mg/dL (ref 100–199)
Chol/HDL Ratio: 5.4 ratio — ABNORMAL HIGH (ref 0.0–5.0)
HDL: 33 mg/dL — ABNORMAL LOW (ref >39)
LDL Calculated: 123 mg/dL — ABNORMAL HIGH (ref 0–99)
Triglycerides: 117 mg/dL (ref 0–149)
VLDL Cholesterol Cal: 23 mg/dL (ref 5–40)

## 2018-04-06 LAB — HEPATITIS C ANTIBODY: Hep C Virus Ab: <0.1 s/co ratio (ref 0.0–0.9)

## 2018-04-06 LAB — VITAMIN B12: Vitamin B-12: 361 pg/mL (ref 232–1245)

## 2018-04-22 ENCOUNTER — Other Ambulatory Visit: Payer: Self-pay

## 2018-04-22 ENCOUNTER — Ambulatory Visit: Payer: 59 | Admitting: Family Medicine

## 2018-04-22 ENCOUNTER — Encounter: Payer: Self-pay | Admitting: Family Medicine

## 2018-04-22 VITALS — BP 132/78 | Ht 68.0 in | Wt 166.0 lb

## 2018-04-22 DIAGNOSIS — Z1211 Encounter for screening for malignant neoplasm of colon: Secondary | ICD-10-CM | POA: Diagnosis not present

## 2018-04-22 DIAGNOSIS — E7849 Other hyperlipidemia: Secondary | ICD-10-CM

## 2018-04-22 DIAGNOSIS — Z125 Encounter for screening for malignant neoplasm of prostate: Secondary | ICD-10-CM

## 2018-04-22 DIAGNOSIS — D510 Vitamin B12 deficiency anemia due to intrinsic factor deficiency: Secondary | ICD-10-CM | POA: Diagnosis not present

## 2018-04-22 MED ORDER — CYANOCOBALAMIN 1000 MCG/ML IJ SOLN
1000.0000 ug | Freq: Once | INTRAMUSCULAR | Status: AC
  Administered 2018-04-22: 1000 ug via INTRAMUSCULAR

## 2018-04-22 NOTE — Patient Instructions (Signed)
Cholesterol    Cholesterol is a fat. Your body needs a small amount of cholesterol. Cholesterol (plaque) may build up in your blood vessels (arteries). That makes you more likely to have a heart attack or stroke.  You cannot feel your cholesterol level. Having a blood test is the only way to find out if your level is high. Keep your test results. Work with your doctor to keep your cholesterol at a good level.  What do the results mean?  Total cholesterol is how much cholesterol is in your blood.  LDL is bad cholesterol. This is the type that can build up. Try to have low LDL.  HDL is good cholesterol. It cleans your blood vessels and carries LDL away. Try to have high HDL.  Triglycerides are fat that the body can store or burn for energy.  What are good levels of cholesterol?  Total cholesterol below 200.  LDL below 100 is good for people who have health risks. LDL below 70 is good for people who have very high risks.  HDL above 40 is good. It is best to have HDL of 60 or higher.  Triglycerides below 150.  How can I lower my cholesterol?  Diet    Follow your diet program as told by your doctor.  Choose fish, white meat chicken, or turkey that is roasted or baked. Try not to eat red meat, fried foods, sausage, or lunch meats.  Eat lots of fresh fruits and vegetables.  Choose whole grains, beans, pasta, potatoes, and cereals.  Choose olive oil, corn oil, or canola oil. Only use small amounts.  Try not to eat butter, mayonnaise, shortening, or palm kernel oils.  Try not to eat foods with trans fats.  Choose low-fat or nonfat dairy foods.  Drink skim or nonfat milk.  Eat low-fat or nonfat yogurt and cheeses.  Try not to drink whole milk or cream.  Try not to eat ice cream, egg yolks, or full-fat cheeses.  Healthy desserts include angel food cake, ginger snaps, animal crackers, hard candy, popsicles, and low-fat or nonfat frozen yogurt. Try not to eat pastries, cakes, pies, and cookies.     Exercise  Follow your  exercise program as told by your doctor.  Be more active. Try gardening, walking, and taking the stairs.  Ask your doctor about ways that you can be more active.  Medicine  Take over-the-counter and prescription medicines only as told by your doctor.  This information is not intended to replace advice given to you by your health care provider. Make sure you discuss any questions you have with your health care provider.  Document Released: 04/25/2008 Document Revised: 08/29/2015 Document Reviewed: 08/09/2015  Elsevier Interactive Patient Education  2019 [employer] Inc[employer]

## 2018-04-22 NOTE — Progress Notes (Signed)
   Subjective:    Patient ID: Kenneth Fritz, male    DOB: July 21, 1955, 63 y.o.   MRN: [id number]  HPIFollow up on lab results.   Needs b12 injection today.  B12 level low normal needs his B12 shot he has not been always on target with getting it monthly Pt states no concerns today.  No anemia on CBC cholesterol profile shows LDL is up HDL slightly low  Patient is not thrilled about having to take much in the way medicines he declines statins  Review of Systems  Constitutional: Negative for diaphoresis and fatigue.  HENT: Negative for congestion and rhinorrhea.   Respiratory: Negative for cough and shortness of breath.   Cardiovascular: Negative for chest pain and leg swelling.  Gastrointestinal: Negative for abdominal pain and diarrhea.  Skin: Negative for color change and rash.  Neurological: Negative for dizziness and headaches.  Psychiatric/Behavioral: Negative for behavioral problems and confusion.       Objective:   Physical Exam Vitals signs reviewed.  Constitutional:      General: He is not in acute distress. HENT:     Head: Normocephalic and atraumatic.  Eyes:     General:        Right eye: No discharge.        Left eye: No discharge.  Neck:     Trachea: No tracheal deviation.  Cardiovascular:     Rate and Rhythm: Normal rate and regular rhythm.     Heart sounds: Normal heart sounds. No murmur.  Pulmonary:     Effort: Pulmonary effort is normal. No respiratory distress.     Breath sounds: Normal breath sounds.  Lymphadenopathy:     Cervical: No cervical adenopathy.  Skin:    General: Skin is warm and dry.  Neurological:     Mental Status: He is alert.     Coordination: Coordination normal.  Psychiatric:        Behavior: Behavior normal.           Assessment & Plan:  Hyperlipidemia-statin advised because of 10% risk of heart disease he defers on statins he will work on diet  Borderline low B12 he will get back in the habit of doing monthly B12  shots  GI referral for colonoscopy  History of prostate cancer years ago will check PSA for completeness  Follow-up 6 months  Do lab work in 3 months

## 2018-04-26 ENCOUNTER — Encounter: Payer: Self-pay | Admitting: Family Medicine

## 2018-05-03 ENCOUNTER — Encounter: Payer: Self-pay | Admitting: Internal Medicine

## 2018-05-26 ENCOUNTER — Other Ambulatory Visit: Payer: Self-pay

## 2018-05-26 ENCOUNTER — Ambulatory Visit (INDEPENDENT_AMBULATORY_CARE_PROVIDER_SITE_OTHER): Payer: 59 | Admitting: *Deleted

## 2018-05-26 ENCOUNTER — Ambulatory Visit: Payer: 59

## 2018-05-26 DIAGNOSIS — D649 Anemia, unspecified: Secondary | ICD-10-CM

## 2018-05-26 MED ORDER — CYANOCOBALAMIN 1000 MCG/ML IJ SOLN
1000.0000 ug | Freq: Once | INTRAMUSCULAR | Status: AC
  Administered 2018-05-26: 1000 ug via INTRAMUSCULAR

## 2018-06-25 ENCOUNTER — Ambulatory Visit: Payer: 59

## 2018-06-30 ENCOUNTER — Ambulatory Visit: Payer: 59

## 2018-08-04 ENCOUNTER — Ambulatory Visit: Payer: 59

## 2018-08-05 ENCOUNTER — Other Ambulatory Visit: Payer: Self-pay

## 2018-08-05 ENCOUNTER — Ambulatory Visit (INDEPENDENT_AMBULATORY_CARE_PROVIDER_SITE_OTHER): Payer: 59 | Admitting: *Deleted

## 2018-08-05 DIAGNOSIS — D510 Vitamin B12 deficiency anemia due to intrinsic factor deficiency: Secondary | ICD-10-CM

## 2018-08-05 MED ORDER — CYANOCOBALAMIN 1000 MCG/ML IJ SOLN
1000.0000 ug | Freq: Once | INTRAMUSCULAR | Status: AC
  Administered 2018-08-05: 1000 ug via INTRAMUSCULAR

## 2018-10-14 ENCOUNTER — Other Ambulatory Visit: Payer: Self-pay

## 2018-10-14 ENCOUNTER — Other Ambulatory Visit (INDEPENDENT_AMBULATORY_CARE_PROVIDER_SITE_OTHER): Payer: 59

## 2018-10-14 DIAGNOSIS — E539 Vitamin B deficiency, unspecified: Secondary | ICD-10-CM

## 2018-10-14 MED ORDER — CYANOCOBALAMIN 1000 MCG/ML IJ SOLN
1000.0000 ug | Freq: Once | INTRAMUSCULAR | Status: AC
  Administered 2018-10-14: 1000 ug via INTRAMUSCULAR

## 2018-12-22 ENCOUNTER — Other Ambulatory Visit: Payer: Self-pay

## 2018-12-22 ENCOUNTER — Other Ambulatory Visit (INDEPENDENT_AMBULATORY_CARE_PROVIDER_SITE_OTHER): Payer: 59 | Admitting: *Deleted

## 2018-12-22 DIAGNOSIS — E538 Deficiency of other specified B group vitamins: Secondary | ICD-10-CM

## 2018-12-22 MED ORDER — CYANOCOBALAMIN 1000 MCG/ML IJ SOLN
1000.0000 ug | Freq: Once | INTRAMUSCULAR | Status: AC
  Administered 2018-12-22: 1000 ug via INTRAMUSCULAR

## 2018-12-24 ENCOUNTER — Other Ambulatory Visit: Payer: Self-pay

## 2018-12-24 ENCOUNTER — Ambulatory Visit
Admission: EM | Admit: 2018-12-24 | Discharge: 2018-12-24 | Disposition: A | Discharge status: Home / Self Care | Payer: 59 | Attending: Internal Medicine | Admitting: Internal Medicine

## 2018-12-24 DIAGNOSIS — L247 Irritant contact dermatitis due to plants, except food: Secondary | ICD-10-CM

## 2018-12-24 MED ORDER — PREDNISONE 10 MG PO TABS: ORAL_TABLET | ORAL | 0 refills | Status: DC

## 2018-12-24 MED ORDER — CETIRIZINE HCL 10 MG PO CAPS: 10.0000 mg | ORAL_CAPSULE | Freq: Every day | ORAL | 0 refills | Status: DC

## 2018-12-24 MED ORDER — METHYLPREDNISOLONE SODIUM SUCC 125 MG IJ SOLR
125.0000 mg | Freq: Once | INTRAMUSCULAR | Status: AC
  Administered 2018-12-24: 19:00:00 125 mg via INTRAMUSCULAR

## 2018-12-24 NOTE — Discharge Instructions (Signed)
We gave you solumedrol today Begin prednisone taper tomorrow morning, take with food Begin with 6 tabs, decrease by 1 tab each day until you complete Cetirizine in the morning, benadryl at night time  Monitor for signs of infection- increased redness, swelling, pain, pus, fever, nausea, vomiting, headache, worsening rash, not resolving- Follow up for any of these

## 2018-12-24 NOTE — ED Triage Notes (Signed)
Pt presents to UC w/ c/o poison oak rash on forearms and one spot on abdomen x1 week. Pt has put cortisone cream on it w/o relief.

## 2018-12-24 NOTE — ED Provider Notes (Signed)
[facility]    CSN: [id number] Arrival date & time: 12/24/18  1847      History   Chief Complaint Chief Complaint  Patient presents with  . Rash    HPI Kenneth Fritz is a 63 y.o. male history of prostate cancer, pernicious anemia, presenting today for evaluation of a rash.  Patient states that over the past week he has had a rash that has been similar to when he has previously had poison ivy.  He has been outside and had likely exposure at work or in his yard.  Has had associated itching, denies pain.  Denies fevers.  He is using hydrocortisone cream topically without relief.  Rash mainly located to forearms as well as abdomen.  Denies lesions on face. Denies lesions to palms or soles.  Denies any difficulty breathing or shortness of breath.  Denies history of diabetes.  HPI  Past Medical History:  Diagnosis Date  . Cancer ([place]) 11/11   Prostate  . Pernicious anemia     Patient Active Problem List   Diagnosis Date Noted  . Bursitis of left shoulder   . Tendinitis of left shoulder   . Pernicious anemia 04/18/2013  . Prostate cancer ([place]) 11/10/2012  . 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 CHOLECYSTECTOMY    . KNEE ARTHROSCOPY Left   . SHOULDER ARTHROSCOPY WITH SUBACROMIAL DECOMPRESSION Left 11/29/2015   Procedure: SHOULDER ARTHROSCOPY WITH SUBACROMIAL DECOMPRESSION;  Surgeon: Carole Civil, MD;  Location: [facility];  Service: Orthopedics;  Laterality: Left;  AND DEBRIDEMENT Pt notified new arrival time 9:45am per KF 10/16  . XI ROBOTIC ASSISTED SIMPLE PROSTATECTOMY     [place] Medications    Prior to Admission medications   Medication Sig Start Date End Date Taking? Authorizing Provider  Cetirizine HCl 10 MG CAPS Take 1 capsule (10 mg total) by mouth daily for 10 days. 12/24/18 01/03/19  Wieters, Hallie C, PA-C  predniSONE (DELTASONE) 10 MG tablet 6 tabs on Sat, 5 tabs  Sun, 4 tabs Mon, 3 tabs Tues, 2 Tabs Wed, 1 tab Thurs 12/24/18   Wieters, [place] C, PA-C    Family History Family History  Problem Relation Age of Onset  . Diabetes Mother   . Healthy Father     Social History Social History   Tobacco Use  . Smoking status: Never Smoker  . Smokeless tobacco: Never Used  Substance Use Topics  . Alcohol use: No  . Drug use: No     Allergies   Patient has no known allergies.   Review of Systems Review of Systems  Constitutional: Negative for fatigue and fever.  Eyes: Negative for redness, itching and visual disturbance.  Respiratory: Negative for shortness of breath.   Cardiovascular: Negative for chest pain and leg swelling.  Gastrointestinal: Negative for nausea and vomiting.  Musculoskeletal: Negative for arthralgias and myalgias.  Skin: Positive for color change and rash. Negative for wound.  Neurological: Negative for dizziness, syncope, weakness, light-headedness and headaches.     Physical Exam Triage Vital Signs ED Triage Vitals  Enc Vitals Group     BP 12/24/18 1854 (!) 151/86     Pulse Rate 12/24/18 1854 72     Resp 12/24/18 1854 16     Temp 12/24/18 1854 98.1 F (36.7 C)     Temp Source 12/24/18 1854 Oral     SpO2 12/24/18 1854 95 %  Weight --      Height --      Head Circumference --      Peak Flow --      Pain Score 12/24/18 1856 0     Pain Loc --      Pain Edu? --      Excl. in [place]? --    No data found.  Updated Vital Signs BP (!) 151/86 (BP Location: Right Arm)   Pulse 72   Temp 98.1 F (36.7 C) (Oral)   Resp 16   SpO2 95%   Visual Acuity Right Eye Distance:   Left Eye Distance:   Bilateral Distance:    Right Eye Near:   Left Eye Near:    Bilateral Near:     Physical Exam Vitals signs and nursing note reviewed.  Constitutional:      Appearance: He is well-developed.  HENT:     Head: Normocephalic and atraumatic.     Mouth/Throat:     Mouth: Mucous membranes are moist.     Comments: No  lesions on oral mucosa, posterior pharynx patent, uvula midline without swelling Eyes:     Extraocular Movements: Extraocular movements intact.     Conjunctiva/sclera: Conjunctivae normal.     Pupils: Pupils are equal, round, and reactive to light.  Neck:     Musculoskeletal: Neck supple.  Cardiovascular:     Rate and Rhythm: Normal rate and regular rhythm.     Heart sounds: No murmur.  Pulmonary:     Effort: Pulmonary effort is normal. No respiratory distress.     Breath sounds: Normal breath sounds.  Abdominal:     Palpations: Abdomen is soft.     Tenderness: There is no abdominal tenderness.  Skin:    General: Skin is warm and dry.     Comments: Erythematous macular papular areas to forearms with vesicles.  Clear drainage;   Isolated erythematous patch to abdomen without vesicles  No lesions noted to palms or soles  Neurological:     Mental Status: He is alert.      UC Treatments / Results  Labs (all labs ordered are listed, but only abnormal results are displayed) Labs Reviewed - No data to display  EKG   Radiology No results found.  Procedures Procedures (including critical care time)  Medications Ordered in UC Medications  methylPREDNISolone sodium succinate (SOLU-MEDROL) 125 mg/2 mL injection 125 mg (has no administration in time range)    Initial Impression / Assessment and Plan / UC Course  I have reviewed the triage vital signs and the nursing notes.  Pertinent labs & imaging results that were available during my care of the patient were reviewed by me and considered in my medical decision making (see chart for details).     Rash consistent with contact dermatitis, likely secondary to plant/poison ivy exposure.  Providing Solu-Medrol IM today followed by prednisone taper.  Continue antihistamines.  Drainage not suggestive of infectious at this time, no systemic symptoms, continue to monitor,Discussed strict return precautions. Patient verbalized  understanding and is agreeable with plan.  Final Clinical Impressions(s) / UC Diagnoses   Final diagnoses:  Irritant contact dermatitis due to plants, except food     Discharge Instructions     We gave you solumedrol today Begin prednisone taper tomorrow morning, take with food Begin with 6 tabs, decrease by 1 tab each day until you complete Cetirizine in the morning, benadryl at night time  Monitor for signs of infection- increased redness, swelling,  pain, pus, fever, nausea, vomiting, headache, worsening rash, not resolving- Follow up for any of these   ED Prescriptions    Medication Sig Dispense Auth. Provider   predniSONE (DELTASONE) 10 MG tablet 6 tabs on Sat, 5 tabs Sun, 4 tabs Mon, 3 tabs Tues, 2 Tabs Wed, 1 tab Thurs 21 tablet Wieters, Hallie C, PA-C   Cetirizine HCl 10 MG CAPS Take 1 capsule (10 mg total) by mouth daily for 10 days. 10 capsule Wieters, [place] C, PA-C     PDMP not reviewed this encounter.   Janith Lima, [place] 12/24/18 1913

## 2019-03-21 ENCOUNTER — Ambulatory Visit (INDEPENDENT_AMBULATORY_CARE_PROVIDER_SITE_OTHER): Payer: 59 | Admitting: Family Medicine

## 2019-03-21 ENCOUNTER — Other Ambulatory Visit: Payer: Self-pay

## 2019-03-21 ENCOUNTER — Encounter: Payer: Self-pay | Admitting: Family Medicine

## 2019-03-21 VITALS — BP 138/80 | Temp 98.4°F | Ht 68.0 in | Wt 164.0 lb

## 2019-03-21 DIAGNOSIS — D510 Vitamin B12 deficiency anemia due to intrinsic factor deficiency: Secondary | ICD-10-CM

## 2019-03-21 DIAGNOSIS — Z8546 Personal history of malignant neoplasm of prostate: Secondary | ICD-10-CM

## 2019-03-21 DIAGNOSIS — E7849 Other hyperlipidemia: Secondary | ICD-10-CM

## 2019-03-21 MED ORDER — CYANOCOBALAMIN 1000 MCG/ML IJ SOLN
1000.0000 ug | Freq: Once | INTRAMUSCULAR | Status: AC
  Administered 2019-03-21: 1000 ug via INTRAMUSCULAR

## 2019-03-21 NOTE — Progress Notes (Signed)
   Subjective:    Patient ID: Kenneth Fritz, male    DOB: Jun 27, 1955, 64 y.o.   MRN: [id number]  HPIfollow up on anemia. Pt states he needs his b12 injection. Has not had one in awhile. Has been working out of town. No problems or concerns today.  Patient with pernicious anemia.  Patient needs to be doing his B12 shots monthly.  I encouraged him to do so We will need to do some lab work He states some fatigue occasionally but never severe   Review of Systems  Constitutional: Positive for fatigue. Negative for diaphoresis.  HENT: Negative for congestion and rhinorrhea.   Respiratory: Negative for cough and shortness of breath.   Cardiovascular: Negative for chest pain and leg swelling.  Gastrointestinal: Negative for abdominal pain and diarrhea.  Skin: Negative for color change and rash.  Neurological: Negative for dizziness and headaches.  Psychiatric/Behavioral: Negative for behavioral problems and confusion.       Objective:   Physical Exam Vitals reviewed.  Constitutional:      General: He is not in acute distress. HENT:     Head: Normocephalic and atraumatic.  Eyes:     General:        Right eye: No discharge.        Left eye: No discharge.  Neck:     Trachea: No tracheal deviation.  Cardiovascular:     Rate and Rhythm: Normal rate and regular rhythm.     Heart sounds: Normal heart sounds. No murmur.  Pulmonary:     Effort: Pulmonary effort is normal. No respiratory distress.     Breath sounds: Normal breath sounds.  Lymphadenopathy:     Cervical: No cervical adenopathy.  Skin:    General: Skin is warm and dry.  Neurological:     Mental Status: He is alert.     Coordination: Coordination normal.  Psychiatric:        Behavior: Behavior normal.           Assessment & Plan:  B12 pernicious anemia check lab work History of prostate cancer check PSA B12 shots monthly very important Colonoscopy recommended patient defers currently

## 2019-05-02 DIAGNOSIS — M79671 Pain in right foot: Secondary | ICD-10-CM | POA: Insufficient documentation

## 2019-05-12 ENCOUNTER — Other Ambulatory Visit: Payer: Self-pay

## 2019-05-12 ENCOUNTER — Other Ambulatory Visit (INDEPENDENT_AMBULATORY_CARE_PROVIDER_SITE_OTHER): Payer: 59

## 2019-05-12 DIAGNOSIS — D510 Vitamin B12 deficiency anemia due to intrinsic factor deficiency: Secondary | ICD-10-CM

## 2019-05-12 MED ORDER — CYANOCOBALAMIN 1000 MCG/ML IJ SOLN
1000.0000 ug | Freq: Once | INTRAMUSCULAR | Status: AC
  Administered 2019-05-12: 1000 ug via INTRAMUSCULAR

## 2019-07-14 ENCOUNTER — Other Ambulatory Visit: Payer: Self-pay

## 2019-07-14 ENCOUNTER — Other Ambulatory Visit (INDEPENDENT_AMBULATORY_CARE_PROVIDER_SITE_OTHER): Payer: 59 | Admitting: *Deleted

## 2019-07-14 DIAGNOSIS — D510 Vitamin B12 deficiency anemia due to intrinsic factor deficiency: Secondary | ICD-10-CM | POA: Diagnosis not present

## 2019-07-14 MED ORDER — CYANOCOBALAMIN 1000 MCG/ML IJ SOLN
1000.0000 ug | Freq: Once | INTRAMUSCULAR | Status: AC
  Administered 2019-07-14: 1000 ug via INTRAMUSCULAR

## 2019-08-01 ENCOUNTER — Encounter: Payer: Self-pay | Admitting: Family Medicine

## 2019-08-02 ENCOUNTER — Other Ambulatory Visit: Payer: Self-pay | Admitting: *Deleted

## 2019-08-02 DIAGNOSIS — Z1211 Encounter for screening for malignant neoplasm of colon: Secondary | ICD-10-CM

## 2019-08-02 NOTE — Telephone Encounter (Signed)
Please go ahead with referral as requested

## 2019-08-02 NOTE — Progress Notes (Signed)
amb ref °

## 2019-08-09 ENCOUNTER — Telehealth: Payer: Self-pay | Admitting: *Deleted

## 2019-08-09 ENCOUNTER — Ambulatory Visit: Payer: 59 | Admitting: Family Medicine

## 2019-08-09 ENCOUNTER — Other Ambulatory Visit: Payer: Self-pay

## 2019-08-09 ENCOUNTER — Other Ambulatory Visit (HOSPITAL_COMMUNITY)
Admission: RE | Admit: 2019-08-09 | Discharge: 2019-08-09 | Disposition: A | Discharge status: Home / Self Care | Payer: 59 | Source: Ambulatory Visit | Attending: Family Medicine | Admitting: Family Medicine

## 2019-08-09 DIAGNOSIS — R109 Unspecified abdominal pain: Secondary | ICD-10-CM | POA: Diagnosis not present

## 2019-08-09 LAB — CBC WITH DIFFERENTIAL/PLATELET
Abs Immature Granulocytes: 0.02 10*3/uL (ref 0.00–0.07)
Basophils Absolute: 0.1 10*3/uL (ref 0.0–0.1)
Basophils Relative: 1 %
Eosinophils Absolute: 0.1 10*3/uL (ref 0.0–0.5)
Eosinophils Relative: 1 %
HCT: 43.1 % (ref 39.0–52.0)
Hemoglobin: 14.6 g/dL (ref 13.0–17.0)
Immature Granulocytes: 0 %
Lymphocytes Relative: 19 %
Lymphs Abs: 1.2 10*3/uL (ref 0.7–4.0)
MCH: 30.5 pg (ref 26.0–34.0)
MCHC: 33.9 g/dL (ref 30.0–36.0)
MCV: 90.2 fL (ref 80.0–100.0)
Monocytes Absolute: 0.5 10*3/uL (ref 0.1–1.0)
Monocytes Relative: 8 %
Neutro Abs: 4.6 10*3/uL (ref 1.7–7.7)
Neutrophils Relative %: 71 %
Platelets: 298 10*3/uL (ref 150–400)
RBC: 4.78 MIL/uL (ref 4.22–5.81)
RDW: 12.6 % (ref 11.5–15.5)
WBC: 6.4 10*3/uL (ref 4.0–10.5)
nRBC: 0 % (ref 0.0–0.2)

## 2019-08-09 LAB — HEPATIC FUNCTION PANEL
ALT: 13 U/L (ref 0–44)
AST: 11 U/L — ABNORMAL LOW (ref 15–41)
Albumin: 4 g/dL (ref 3.5–5.0)
Alkaline Phosphatase: 77 U/L (ref 38–126)
Bilirubin, Direct: 0.1 mg/dL (ref 0.0–0.2)
Indirect Bilirubin: 0.9 mg/dL (ref 0.3–0.9)
Total Bilirubin: 1 mg/dL (ref 0.3–1.2)
Total Protein: 6.9 g/dL (ref 6.5–8.1)

## 2019-08-09 LAB — BASIC METABOLIC PANEL
Anion gap: 7 (ref 5–15)
BUN: 16 mg/dL (ref 8–23)
CO2: 28 mmol/L (ref 22–32)
Calcium: 9 mg/dL (ref 8.9–10.3)
Chloride: 101 mmol/L (ref 98–111)
Creatinine, Ser: 0.99 mg/dL (ref 0.61–1.24)
GFR calc Af Amer: >60 mL/min (ref >60)
GFR calc non Af Amer: >60 mL/min (ref >60)
Glucose, Bld: 103 mg/dL — ABNORMAL HIGH (ref 70–99)
Potassium: 4.7 mmol/L (ref 3.5–5.1)
Sodium: 136 mmol/L (ref 135–145)

## 2019-08-09 LAB — LIPASE, BLOOD: Lipase: 63 U/L — ABNORMAL HIGH (ref 11–51)

## 2019-08-09 LAB — PSA: Prostatic Specific Antigen: <0.01 ng/mL (ref 0.00–4.00)

## 2019-08-09 NOTE — Progress Notes (Signed)
   Subjective:    Patient ID: Kenneth Fritz, male    DOB: 08-Jun-1955, 64 y.o.   MRN: [id number]  Abdominal Pain This is a new problem. Episode onset: 2 weeks. Pain location: lower abdomen. The quality of the pain is aching. Treatments tried: tums.   Mid to low abd constant  Also notices at night Sharp at times other times just aches enrgy ok Still working [employer] but now better No blood in stool No fevers Appetite ok No injury Never had before He relates significant lower abdominal pain for the past 3 weeks progressive.  Causing a lot of aching and discomfort describes as a 7 out of 10 does not at the time wake him up but it is present when he gets up to go to the bathroom Has a history of prostate cancer Review of Systems  Gastrointestinal: Positive for abdominal pain.  Denies fever chills night sweats weight loss denies appetite change denies rectal bleeding hematuria     Objective:   Physical Exam  Lungs clear respiratory rate normal heart regular no murmurs abdomen is soft with lower abdominal tenderness no guarding or rebound extremities no edema skin warm dry rectal exam is normal Hemoccult negative      Assessment & Plan:  Patient with significant lower abdominal pain and discomfort with tenderness.  This is concerning for the possibility of diverticulitis but it is also concerning that there could be other issues going on cannot exclude the possibility of an abscess or cancer so therefore needs to have lab work and CAT scan  Patient was referred for colonoscopy earlier this spring but gastroenterology is significantly behind and the patient has not had this set up at this point

## 2019-08-09 NOTE — Telephone Encounter (Signed)
Lab work showed lipase slightly elevated, proceed forward with CT scan, my chart message sent to the patient

## 2019-08-09 NOTE — Telephone Encounter (Signed)
Stat bw results ready in epic. brendale is working on [facility] for ct and knows it needs to be tomorrow or Thursday.

## 2019-08-10 NOTE — Telephone Encounter (Signed)
Results discussed with patient. Patient advised per Dr Nicki Reaper : Lipase level is slightly elevated but this does not completely explain the abdominal pain.  Elevated lipase can occur with pancreatitis.  Avoid all fatty and fried foods for now.  Proceed forward with CT scan as planned this week. Patient verbalized understanding.

## 2019-08-10 NOTE — Telephone Encounter (Signed)
Lmtc. See results note.  

## 2019-08-12 ENCOUNTER — Encounter: Payer: Self-pay | Admitting: Family Medicine

## 2019-08-12 ENCOUNTER — Telehealth: Payer: Self-pay | Admitting: *Deleted

## 2019-08-12 ENCOUNTER — Ambulatory Visit (HOSPITAL_COMMUNITY): Payer: 59

## 2019-08-12 NOTE — Telephone Encounter (Signed)
Left message to return call 

## 2019-08-12 NOTE — Telephone Encounter (Signed)
Finally got approval from insurance for CT scan. Pt told brendale when she tried to schedule ct stat today that he was unable to go today. He was going out of town today and would be back on Monday and wanted for Tuesday. Ct is scheduled for Tuesday.

## 2019-08-12 NOTE — Telephone Encounter (Signed)
Please let patient know that his lab work overall looked good but lipase was slightly.  This could be an indication of inflammation of the pancreas.  It is best to avoid fried foods.  Also avoid alcohol.  We will do a follow-up scan on Tuesday and should have results by Wednesday  Warning signs to watch for just in case-if high fevers projectile vomiting severe abdominal pain-then it would be recommended to go to emergency department

## 2019-08-16 ENCOUNTER — Other Ambulatory Visit: Payer: Self-pay

## 2019-08-16 ENCOUNTER — Ambulatory Visit (HOSPITAL_COMMUNITY)
Admission: RE | Admit: 2019-08-16 | Discharge: 2019-08-16 | Disposition: A | Discharge status: Home / Self Care | Payer: 59 | Source: Ambulatory Visit | Attending: Family Medicine | Admitting: Family Medicine

## 2019-08-16 DIAGNOSIS — R109 Unspecified abdominal pain: Secondary | ICD-10-CM | POA: Diagnosis not present

## 2019-08-16 MED ORDER — IOHEXOL 300 MG/ML  SOLN
100.0000 mL | Freq: Once | INTRAMUSCULAR | Status: AC | PRN
  Administered 2019-08-16: 100 mL via INTRAVENOUS

## 2019-08-17 ENCOUNTER — Other Ambulatory Visit: Payer: Self-pay | Admitting: Family Medicine

## 2019-08-17 ENCOUNTER — Encounter: Payer: Self-pay | Admitting: Family Medicine

## 2019-08-17 DIAGNOSIS — R748 Abnormal levels of other serum enzymes: Secondary | ICD-10-CM

## 2019-08-17 DIAGNOSIS — R109 Unspecified abdominal pain: Secondary | ICD-10-CM

## 2019-08-17 LAB — POCT URINALYSIS DIPSTICK
Spec Grav, UA: 1.025 (ref 1.010–1.025)
pH, UA: 5 (ref 5.0–8.0)

## 2019-08-17 NOTE — Telephone Encounter (Signed)
Spoke with patient. Pt verbalized understanding  °

## 2019-08-17 NOTE — Addendum Note (Signed)
Addended by: Vicente Males on: 08/17/2019 04:51 PM   Modules accepted: Orders

## 2019-08-18 ENCOUNTER — Encounter: Payer: Self-pay | Admitting: Family Medicine

## 2019-08-18 ENCOUNTER — Encounter: Payer: Self-pay | Admitting: Internal Medicine

## 2019-08-19 LAB — URINE CULTURE

## 2019-08-19 LAB — SPECIMEN STATUS REPORT

## 2019-08-26 ENCOUNTER — Encounter: Payer: Self-pay | Admitting: Family Medicine

## 2019-08-26 ENCOUNTER — Other Ambulatory Visit: Payer: Self-pay | Admitting: *Deleted

## 2019-08-26 DIAGNOSIS — R109 Unspecified abdominal pain: Secondary | ICD-10-CM

## 2019-10-13 ENCOUNTER — Other Ambulatory Visit: Payer: 59

## 2019-10-13 ENCOUNTER — Other Ambulatory Visit: Payer: Self-pay

## 2019-10-13 ENCOUNTER — Ambulatory Visit (INDEPENDENT_AMBULATORY_CARE_PROVIDER_SITE_OTHER): Payer: 59 | Admitting: Nurse Practitioner

## 2019-10-13 ENCOUNTER — Encounter: Payer: Self-pay | Admitting: Nurse Practitioner

## 2019-10-13 DIAGNOSIS — Z0000 Encounter for general adult medical examination without abnormal findings: Secondary | ICD-10-CM | POA: Diagnosis not present

## 2019-10-13 DIAGNOSIS — R109 Unspecified abdominal pain: Secondary | ICD-10-CM | POA: Insufficient documentation

## 2019-10-13 DIAGNOSIS — R1030 Lower abdominal pain, unspecified: Secondary | ICD-10-CM

## 2019-10-13 MED ORDER — DICYCLOMINE HCL 10 MG PO CAPS: 10.0000 mg | ORAL_CAPSULE | Freq: Three times a day (TID) | ORAL | 1 refills | Status: DC | PRN

## 2019-10-13 NOTE — Progress Notes (Signed)
Primary Care Physician:  Kathyrn Drown, MD Primary Gastroenterologist:  Dr. Gala Romney  Chief Complaint  Patient presents with  . Abdominal Pain    abd pain x 3 mo, starts in l side and radiates across stomach    HPI:   Kenneth Fritz is a 64 y.o. male who presents on referral from primary care for abdominal pain.  Reviewed information associated with referral including office visit dated 08/09/2019 for the same.  Patient noted 2 weeks of abdominal pain in the lower abdomen described as aching, has tried Tums without relief.  Bowel movements are firm but better, denied hematochezia.  Recommended labs and CT scan.  Previously referred for colonoscopy, but had not been completed as of yet.  Labs completed 08/09/2019 with normal PSA, essentially normal BMP, essentially normal HFP, normal CBC, mildly elevated lipase of 63.  CT of the abdomen and pelvis completed 08/16/2019 found mild posterior bladder wall thickening can be seen with cystitis or UTI and recommended correlation with UA, fat-containing umbilical and supraumbilical ventral abdominal wall hernias without bowel involvement or inflammation, status post prostatectomy.  Specifically the pancreas looked normal.  The liver had multiple scattered hypodense lesions felt to be simple cysts.  Today he states he is doing okay overall. He has been having abdominal pain for the last 3 months. Waxes and wanes in intenstiy but never fully resolves. Has a bowel movement several times a day, no changes in his stools, consistent with [place] 4. Denies GERD symptoms. No N/V, hematochezia, melena. Pain starts typically LLQ and will radiate around the front of his abdomen. The last couple mornings he woke and the pain was mid-lower abdomen. No improvement in pain after a bowel movement. Denies dysuria, urinary frequency. Denies fever, chills, unintentional weight loss. He is due for a colonoscopy and was scheduled for last year but this was cancelled/daleyed  due to COVID-19 pandemic. Denies URI or flu-like symptoms. Denies loss of sense of taste or smell. The patient has received COVID-19 vaccination(s). Denies chest pain, dyspnea, dizziness, lightheadedness, syncope, near syncope. Denies any other upper or lower GI symptoms.  No on any medications currently.  Past Medical History:  Diagnosis Date  . Cancer ([place]) 11/11   Prostate  . Pernicious anemia     Past Surgical History:  Procedure Laterality Date  . CHOLECYSTECTOMY    . KNEE ARTHROSCOPY Left   . SHOULDER ARTHROSCOPY WITH SUBACROMIAL DECOMPRESSION Left 11/29/2015   Procedure: SHOULDER ARTHROSCOPY WITH SUBACROMIAL DECOMPRESSION;  Surgeon: Carole Civil, MD;  Location: [facility];  Service: Orthopedics;  Laterality: Left;  AND DEBRIDEMENT Pt notified new arrival time 9:45am per [facility] 10/16  . XI ROBOTIC ASSISTED SIMPLE PROSTATECTOMY     [name]    No current outpatient medications on file.   No current facility-administered medications for this visit.    Allergies as of 10/13/2019  . (No Known Allergies)    Family History  Problem Relation Age of Onset  . Diabetes Mother   . Healthy Father   . Colon cancer Neg Hx   . Gastric cancer Neg Hx   . Esophageal cancer Neg Hx     Social History   Socioeconomic History  . Marital status: Married    Spouse name: Not on file  . Number of children: Not on file  . Years of education: Not on file  . Highest education level: Not on file  Occupational History  . Not on file  Tobacco Use  . Smoking  status: Never Smoker  . Smokeless tobacco: Never Used  Substance and Sexual Activity  . Alcohol use: No  . Drug use: No  . Sexual activity: Yes    Birth control/protection: None  Other Topics Concern  . Not on file  Social History Narrative  . Not on file   Social Determinants of Health   Financial Resource Strain:   . Difficulty of Paying Living Expenses: Not on file  Food Insecurity:   . Worried About Sales executive in the Last Year: Not on file  . Ran Out of Food in the Last Year: Not on file  Transportation Needs:   . Lack of Transportation (Medical): Not on file  . Lack of Transportation (Non-Medical): Not on file  Physical Activity:   . Days of Exercise per Week: Not on file  . Minutes of Exercise per Session: Not on file  Stress:   . Feeling of Stress : Not on file  Social Connections:   . Frequency of Communication with Friends and Family: Not on file  . Frequency of Social Gatherings with Friends and Family: Not on file  . Attends Religious Services: Not on file  . Active Member of Clubs or Organizations: Not on file  . Attends Archivist Meetings: Not on file  . Marital Status: Not on file  Intimate Partner Violence:   . Fear of Current or Ex-Partner: Not on file  . Emotionally Abused: Not on file  . Physically Abused: Not on file  . Sexually Abused: Not on file    Subjective: Review of Systems  Constitutional: Negative for chills, fever, malaise/fatigue and weight loss.  HENT: Negative for congestion and sore throat.   Respiratory: Negative for cough and shortness of breath.   Cardiovascular: Negative for chest pain and palpitations.  Gastrointestinal: Negative for abdominal pain, blood in stool, diarrhea, melena, nausea and vomiting.  Musculoskeletal: Negative for joint pain and myalgias.  Skin: Negative for rash.  Neurological: Negative for dizziness and weakness.  Endo/Heme/Allergies: Does not bruise/bleed easily.  Psychiatric/Behavioral: Negative for depression. The patient is not nervous/anxious.   All other systems reviewed and are negative.      Objective: BP 139/78   Pulse 60   Temp (!) 97.2 F (36.2 C) (Oral)   Ht 5\' 9"  (1.753 m)   Wt 165 lb (74.8 kg)   BMI 24.37 kg/m  Physical Exam Vitals and nursing note reviewed.  Constitutional:      General: He is not in acute distress.    Appearance: Normal appearance. He is not ill-appearing,  toxic-appearing or diaphoretic.  HENT:     Head: Normocephalic and atraumatic.     Nose: No congestion or rhinorrhea.  Eyes:     General: No scleral icterus. Cardiovascular:     Rate and Rhythm: Normal rate and regular rhythm.     Heart sounds: Normal heart sounds.  Pulmonary:     Effort: Pulmonary effort is normal.     Breath sounds: Normal breath sounds.  Abdominal:     General: Bowel sounds are normal. There is no distension.     Palpations: Abdomen is soft. There is no hepatomegaly, splenomegaly or mass.     Tenderness: There is no abdominal tenderness. There is no guarding or rebound.     Hernia: No hernia is present.  Musculoskeletal:     Cervical back: Neck supple.  Skin:    General: Skin is warm and dry.     Coloration: Skin is  not jaundiced.     Findings: No bruising or rash.  Neurological:     General: No focal deficit present.     Mental Status: He is alert and oriented to person, place, and time. Mental status is at baseline.  Psychiatric:        Mood and Affect: Mood normal.        Behavior: Behavior normal.        Thought Content: Thought content normal.      Assessment:  Pleasant 64 year old male who presents on referral from primary care for lower abdominal pain.  His pain, as described in HPI, is left lower quadrant typically and radiates around to his lower abdomen.  Past couple days it has been mid lower abdomen.  There is question of possible diverticulitis and a CT scan was ordered which was essentially normal other than what appeared to be benign liver simple cysts.  His labs are essentially normal other than very mildly elevated lipase, but pancreas on CT scan was normal.  There is some bladder wall thickening and question possible UTI cystitis.  Today he denies urinary symptoms to suggest ongoing UTI.  He still having abdominal pain which is not always bad, constant, waxes and wanes in severity.  At this point he has had a pretty good work-up for his  abdominal pain.  Query functional GI disorders such as IBS or spastic colon.  We will try dicyclomine 10 mg.  He is due for colonoscopy regardless which had to be delayed due to COVID-19 pandemic.  At this point we will plan for the colonoscopy to further evaluate for any possible etiologies related to his abdominal pain and to accomplish his colon cancer screening.  Proceed with colonoscopy by Dr. Gala Romney in near future: the risks, benefits, and alternatives have been discussed with the patient in detail. The patient states understanding and desires to proceed.  The patient is not on any anticoagulants, anxiolytics, chronic pain medications, antidepressants, antidiabetics, or iron supplements.  Denies alcohol and drug use.  We will plan for the procedure on conscious sedation which should be adequate for him.   Plan: 1. Dicyclomine 10 mg up to 3 times a day as needed 2. Colonoscopy as per above 3. Call for worsening symptoms 4. Follow-up in 2 months   Thank you for allowing [place] to participate in the care of Chip Canepa Zenker  Walden Field, DNP, AGNP-C Adult & Gerontological Nurse Practitioner [facility]   10/13/2019 2:45 PM   Disclaimer: This note was dictated with voice recognition software. Similar sounding words can inadvertently be transcribed and may not be corrected upon review.

## 2019-10-13 NOTE — Patient Instructions (Signed)
Your health issues we discussed today were:   Abdominal pain with normal CT, need for colonoscopy: 1. As discussed we will plan for colonoscopy to further evaluate your symptoms and to accomplish cancer screening 40 2. I sent a prescription for dicyclomine (Bentyl) 10 mg.  He can take this up to 3 times a day as needed for worsening abdominal pain 3. If dicyclomine does not help or causes any adverse effects such as constipation, blurred vision, dry mouth that is intolerable then stop taking the medication and notify our office 4. Call for any worsening or severe symptoms  Overall I recommend:  1. Continue your other current medications 2. Return for follow-up in 2 months 3. Call us if you have any questions or concerns   ---------------------------------------------------------------  I am glad you have gotten your COVID-19 vaccination!  Even though you are fully vaccinated you should continue to follow CDC and state/local guidelines.  ---------------------------------------------------------------   At [facility] we value your feedback. You may receive a survey about your visit today. Please share your experience as we strive to create trusting relationships with our patients to provide genuine, compassionate, quality care.  We appreciate your understanding and patience as we review any laboratory studies, imaging, and other diagnostic tests that are ordered as we care for you. Our office policy is 5 business days for review of these results, and any emergent or urgent results are addressed in a timely manner for your best interest. If you do not hear from our office in 1 week, please contact us.   We also encourage the use of MyChart, which contains your medical information for your review as well. If you are not enrolled in this feature, an access code is on this after visit summary for your convenience. Thank you for allowing [place] to be involved in your care.  It was  great to see you today!  I hope you have a great rest of your summer!!

## 2019-10-13 NOTE — Progress Notes (Signed)
Cc'ed to pcp °

## 2019-10-28 ENCOUNTER — Other Ambulatory Visit: Payer: Self-pay | Admitting: Nurse Practitioner

## 2019-10-28 DIAGNOSIS — Z0000 Encounter for general adult medical examination without abnormal findings: Secondary | ICD-10-CM

## 2019-10-28 DIAGNOSIS — R1030 Lower abdominal pain, unspecified: Secondary | ICD-10-CM

## 2019-11-21 ENCOUNTER — Other Ambulatory Visit: Payer: 59

## 2019-11-21 ENCOUNTER — Other Ambulatory Visit: Payer: Self-pay | Admitting: *Deleted

## 2019-11-21 DIAGNOSIS — Z20822 Contact with and (suspected) exposure to covid-19: Secondary | ICD-10-CM

## 2019-11-22 LAB — NOVEL CORONAVIRUS, NAA: SARS-CoV-2, NAA: NOT DETECTED

## 2019-11-22 LAB — SPECIMEN STATUS REPORT

## 2019-11-22 LAB — SARS-COV-2, NAA 2 DAY TAT

## 2019-11-23 ENCOUNTER — Encounter: Payer: Self-pay | Admitting: Family Medicine

## 2019-11-23 ENCOUNTER — Ambulatory Visit (INDEPENDENT_AMBULATORY_CARE_PROVIDER_SITE_OTHER): Payer: 59 | Admitting: Family Medicine

## 2019-11-23 VITALS — HR 82 | Temp 98.8°F | Resp 16

## 2019-11-23 DIAGNOSIS — R059 Cough, unspecified: Secondary | ICD-10-CM

## 2019-11-23 MED ORDER — BENZONATATE 100 MG PO CAPS: 100.0000 mg | ORAL_CAPSULE | Freq: Two times a day (BID) | ORAL | 0 refills | Status: DC | PRN

## 2019-11-23 NOTE — Progress Notes (Signed)
Patient ID: Kenneth Fritz, male    DOB: August 04, 1955, 64 y.o.   MRN: [id number]   Chief Complaint  Patient presents with  . Cough   Subjective:  CC: sore throat, cough, fever, diarrhea, fatigue  Sore throat, cough, fever, diarrhea, and fatigue all started this past Friday.  Went on Monday after symptoms started and tested negative for Covid he reports that his maximal temperature it was 10 2-1 03 treated that with Tylenol.  Denies shortness of breath, denies chest pain.  Cough This is a new problem. Episode onset: Friday. Had covid test monday that came back negative. Associated symptoms include a fever, nasal congestion and a sore throat. Associated symptoms comments: Fatigue . Treatments tried: tylenol.     Medical History [name] has a past medical history of Cancer ([place]) (11/11) and Pernicious anemia.   Outpatient Encounter Medications as of 11/23/2019  Medication Sig  . benzonatate (TESSALON) 100 MG capsule Take 1 capsule (100 mg total) by mouth 2 (two) times daily as needed for cough.  . dicyclomine (BENTYL) 10 MG capsule TAKE 1 CAPSULE (10 MG TOTAL) BY MOUTH 3 (THREE) TIMES DAILY AS NEEDED FOR SPASMS (ABDOMINAL PAIN). (Patient not taking: Reported on 11/23/2019)   No facility-administered encounter medications on file as of 11/23/2019.     Review of Systems  Constitutional: Positive for fever.  HENT: Positive for sore throat.   Respiratory: Positive for cough.      Vitals Pulse 82   Temp 98.8 F (37.1 C)   Resp 16   SpO2 95%   Objective:   Physical Exam Constitutional:      General: He is not in acute distress.    Appearance: Normal appearance. He is not ill-appearing or toxic-appearing.  HENT:     Right Ear: Tympanic membrane normal.     Left Ear: Tympanic membrane normal.     Nose: Nose normal.     Mouth/Throat:     Mouth: Mucous membranes are moist.     Pharynx: Oropharynx is clear. No oropharyngeal exudate or posterior oropharyngeal erythema.    Cardiovascular:     Rate and Rhythm: Normal rate and regular rhythm.     Heart sounds: Normal heart sounds.  Pulmonary:     Effort: Pulmonary effort is normal.     Breath sounds: Normal breath sounds.  Abdominal:     Tenderness: There is no abdominal tenderness.  Skin:    General: Skin is warm and dry.  Neurological:     Mental Status: He is alert and oriented to person, place, and time.  Psychiatric:        Mood and Affect: Mood normal.        Behavior: Behavior normal.        Thought Content: Thought content normal.        Judgment: Judgment normal.      Assessment and Plan   1. Cough - Novel Coronavirus, NAA ([facility]) - benzonatate (TESSALON) 100 MG capsule; Take 1 capsule (100 mg total) by mouth 2 (two) times daily as needed for cough.  Dispense: 20 capsule; Refill: 0   I retested Mr. [name] for Covid today, it is possible that he tested too soon from when his symptoms started.  It is likely that this is a viral infection, and we will treat this symptomatically.  His most worrisome symptom is cough especially at night, cough medicine sent to the pharmacy.  He will treat his congestion with over-the-counter medications.  He will rest, stay  hydrated, and treat his symptoms as needed. Quarantine instructions given, these are to include his wife as well.  Work note provided, stating that he is pending a Covid test, and that he can return to work once results are negative and or 10 days have passed from symptoms start.  Agrees with plan of care discussed today. Understands warning signs to seek further care: Shortness of breath, chest pain, or any concerning symptoms. Understands to follow-up if symptoms do not improve, or anything changes.  We will notify him once the Covid results are available.

## 2019-11-24 ENCOUNTER — Other Ambulatory Visit: Payer: 59

## 2019-11-25 ENCOUNTER — Encounter: Payer: Self-pay | Admitting: Family Medicine

## 2019-11-25 ENCOUNTER — Other Ambulatory Visit: Payer: Self-pay | Admitting: Family Medicine

## 2019-11-25 DIAGNOSIS — J988 Other specified respiratory disorders: Secondary | ICD-10-CM | POA: Insufficient documentation

## 2019-11-25 LAB — SARS-COV-2, NAA 2 DAY TAT

## 2019-11-25 LAB — NOVEL CORONAVIRUS, NAA: SARS-CoV-2, NAA: NOT DETECTED

## 2019-11-25 LAB — SPECIMEN STATUS REPORT

## 2019-11-25 MED ORDER — CEPHALEXIN 500 MG PO CAPS: 500.0000 mg | ORAL_CAPSULE | Freq: Two times a day (BID) | ORAL | 0 refills | Status: DC

## 2019-11-29 ENCOUNTER — Ambulatory Visit (INDEPENDENT_AMBULATORY_CARE_PROVIDER_SITE_OTHER): Payer: 59 | Admitting: Family Medicine

## 2019-11-29 ENCOUNTER — Other Ambulatory Visit: Payer: Self-pay

## 2019-11-29 ENCOUNTER — Encounter: Payer: Self-pay | Admitting: Family Medicine

## 2019-11-29 VITALS — HR 76 | Temp 99.4°F | Resp 16

## 2019-11-29 DIAGNOSIS — J988 Other specified respiratory disorders: Secondary | ICD-10-CM | POA: Diagnosis not present

## 2019-11-29 NOTE — Progress Notes (Signed)
   Patient ID: Kenneth Fritz, male    DOB: [birth year], 64 y.o.   MRN: [id number]   Chief Complaint  Patient presents with  . Sore Throat    Continued congestion, sore throat and cough. Does feel better since starting antibiotic.    Subjective:  CC: follow-up from fever and congestion, needs work note.  Mr. Kenneth Fritz was seen today in an outside visit this is a follow-up from 10/13.  On October 13, he was seen for cough and fever.  His T-max at that time was 103.0.  Covid test was negative.  On October 15, he sent a message via MyChart that he was not improved, and his congestion was worse and he was still running fever.  Antibiotic was ordered at that time.  He returns today as he is feeling much better, and requires a return to work note.    Medical History Kenneth Fritz has a past medical history of Cancer ([place]) (11/11) and Pernicious anemia.   Outpatient Encounter Medications as of 11/29/2019  Medication Sig  . benzonatate (TESSALON) 100 MG capsule Take 1 capsule (100 mg total) by mouth 2 (two) times daily as needed for cough. (Patient taking differently: Take 100 mg by mouth 2 (two) times daily. )  . cephALEXin (KEFLEX) 500 MG capsule Take 1 capsule (500 mg total) by mouth 2 (two) times daily.  Marland Kitchen dicyclomine (BENTYL) 10 MG capsule TAKE 1 CAPSULE (10 MG TOTAL) BY MOUTH 3 (THREE) TIMES DAILY AS NEEDED FOR SPASMS (ABDOMINAL PAIN). (Patient not taking: Reported on 11/23/2019)   No facility-administered encounter medications on file as of 11/29/2019.     Review of Systems  Constitutional: Negative for chills and fever.  Respiratory: Positive for cough. Negative for shortness of breath.   Gastrointestinal: Positive for abdominal pain.       Not new- colonoscopy next week.     Vitals Pulse 76   Temp 99.4 F (37.4 C)   Resp 16   SpO2 97%   Objective:   Physical Exam Constitutional:      Appearance: He is well-developed.  Cardiovascular:     Rate and Rhythm: Regular rhythm.      Heart sounds: Normal heart sounds.  Pulmonary:     Effort: Pulmonary effort is normal.     Breath sounds: Normal breath sounds. No rales.  Neurological:     Mental Status: He is alert.      Assessment and Plan   1. Respiratory tract infection He started his antibiotic on Friday, he is much improved today.  Denies fever, chest pain, shortness of breath. Lungs clear.  Continues to have a cough.  Wishes to return to work on Monday, October 25, work note given.  He looks like he is on the mend, feels much better.  Agrees with plan of care discussed today. Understands warning signs to seek further care: symptoms return, or worsen. Chest pain, shortness of breath.  Understands to follow-up if symptoms return, worsen.

## 2019-12-02 ENCOUNTER — Telehealth: Payer: Self-pay

## 2019-12-02 DIAGNOSIS — Z029 Encounter for administrative examinations, unspecified: Secondary | ICD-10-CM

## 2019-12-02 NOTE — Telephone Encounter (Signed)
Patient had short term disability faxed to be completed in your box. Patient states needs as soon as possible.

## 2019-12-06 ENCOUNTER — Other Ambulatory Visit: Payer: Self-pay

## 2019-12-06 ENCOUNTER — Other Ambulatory Visit (HOSPITAL_COMMUNITY)
Admission: RE | Admit: 2019-12-06 | Discharge: 2019-12-06 | Disposition: A | Discharge status: Home / Self Care | Payer: 59 | Source: Ambulatory Visit | Attending: Internal Medicine | Admitting: Internal Medicine

## 2019-12-06 DIAGNOSIS — Z01812 Encounter for preprocedural laboratory examination: Secondary | ICD-10-CM | POA: Diagnosis not present

## 2019-12-06 DIAGNOSIS — Z20822 Contact with and (suspected) exposure to covid-19: Secondary | ICD-10-CM | POA: Insufficient documentation

## 2019-12-06 LAB — SARS CORONAVIRUS 2 (TAT 6-24 HRS): SARS Coronavirus 2: NEGATIVE

## 2019-12-07 ENCOUNTER — Encounter (HOSPITAL_COMMUNITY)
Admission: RE | Disposition: A | Discharge status: Home / Self Care | Payer: Self-pay | Source: Home / Self Care | Attending: Internal Medicine

## 2019-12-07 ENCOUNTER — Other Ambulatory Visit: Payer: Self-pay

## 2019-12-07 ENCOUNTER — Ambulatory Visit (HOSPITAL_COMMUNITY)
Admission: RE | Admit: 2019-12-07 | Discharge: 2019-12-07 | Disposition: A | Discharge status: Home / Self Care | Payer: 59 | Attending: Internal Medicine | Admitting: Internal Medicine

## 2019-12-07 ENCOUNTER — Encounter (HOSPITAL_COMMUNITY): Payer: Self-pay | Admitting: Internal Medicine

## 2019-12-07 DIAGNOSIS — Z8546 Personal history of malignant neoplasm of prostate: Secondary | ICD-10-CM | POA: Diagnosis not present

## 2019-12-07 DIAGNOSIS — D122 Benign neoplasm of ascending colon: Secondary | ICD-10-CM | POA: Insufficient documentation

## 2019-12-07 DIAGNOSIS — K635 Polyp of colon: Secondary | ICD-10-CM

## 2019-12-07 DIAGNOSIS — Z1211 Encounter for screening for malignant neoplasm of colon: Secondary | ICD-10-CM | POA: Insufficient documentation

## 2019-12-07 DIAGNOSIS — D124 Benign neoplasm of descending colon: Secondary | ICD-10-CM | POA: Diagnosis not present

## 2019-12-07 HISTORY — PX: COLONOSCOPY: SHX5424

## 2019-12-07 HISTORY — PX: POLYPECTOMY: SHX5525

## 2019-12-07 SURGERY — COLONOSCOPY
Anesthesia: Moderate Sedation

## 2019-12-07 MED ORDER — MIDAZOLAM HCL 5 MG/5ML IJ SOLN
INTRAMUSCULAR | Status: AC
  Filled 2019-12-07: qty 10

## 2019-12-07 MED ORDER — ONDANSETRON HCL 4 MG/2ML IJ SOLN
INTRAMUSCULAR | Status: DC | PRN
  Administered 2019-12-07: 4 mg via INTRAVENOUS

## 2019-12-07 MED ORDER — SODIUM CHLORIDE 0.9 % IV SOLN: INTRAVENOUS | Status: DC

## 2019-12-07 MED ORDER — MEPERIDINE HCL 50 MG/ML IJ SOLN
INTRAMUSCULAR | Status: AC
  Filled 2019-12-07: qty 1

## 2019-12-07 MED ORDER — MIDAZOLAM HCL 5 MG/5ML IJ SOLN
INTRAMUSCULAR | Status: DC | PRN
  Administered 2019-12-07: 1 mg via INTRAVENOUS
  Administered 2019-12-07 (×2): 2 mg via INTRAVENOUS

## 2019-12-07 MED ORDER — STERILE WATER FOR IRRIGATION IR SOLN
Status: DC | PRN
  Administered 2019-12-07: 1.5 mL

## 2019-12-07 MED ORDER — ONDANSETRON HCL 4 MG/2ML IJ SOLN
INTRAMUSCULAR | Status: AC
  Filled 2019-12-07: qty 2

## 2019-12-07 MED ORDER — MEPERIDINE HCL 100 MG/ML IJ SOLN
INTRAMUSCULAR | Status: DC | PRN
  Administered 2019-12-07: 25 mg via INTRAVENOUS
  Administered 2019-12-07: 15 mg via INTRAVENOUS

## 2019-12-07 NOTE — Progress Notes (Signed)
Patient, Kenneth Fritz, had a procedure at [facility] on 12/07/19.  May return to work 12/09/2019 without restrictions.

## 2019-12-07 NOTE — Op Note (Signed)
[facility] Patient Name: Kenneth Fritz Procedure Date: 12/07/2019 11:00 AM MRN: [id number] Date of Birth: Oct 22, 1955 Attending MD: Norvel Richards , MD CSN: [id number] Age: 64 Admit Type: Outpatient Procedure:                Colonoscopy Indications:              Screening for colorectal malignant neoplasm Providers:                Norvel Richards, MD, Jeanann Lewandowsky. Sharon Seller, RN,                            Raphael Gibney, Technician Referring MD:              Medicines:                Midazolam 6 mg IV, Meperidine 40 mg IV Complications:            No immediate complications. Estimated Blood Loss:     Estimated blood loss was minimal. Procedure:                Pre-Anesthesia Assessment:                           - Prior to the procedure, a History and Physical                            was performed, and patient medications and                            allergies were reviewed. The patient's tolerance of                            previous anesthesia was also reviewed. The risks                            and benefits of the procedure and the sedation                            options and risks were discussed with the patient.                            All questions were answered, and informed consent                            was obtained. Prior Anticoagulants: The patient has                            taken no previous anticoagulant or antiplatelet                            agents. ASA Grade Assessment: II - A patient with                            mild systemic disease. After reviewing the risks  and benefits, the patient was deemed in                            satisfactory condition to undergo the procedure.                           After obtaining informed consent, the colonoscope                            was passed under direct vision. Throughout the                            procedure, the patient's blood pressure, pulse, and                             oxygen saturations were monitored continuously. The                            CF-[id number] ([id number]) scope was introduced through                            the anus and advanced to the the cecum, identified                            by appendiceal orifice and ileocecal valve. The                            colonoscopy was performed without difficulty. The                            patient tolerated the procedure well. The quality                            of the bowel preparation was adequate. Scope In: 11:07:34 AM Scope Out: 11:27:18 AM Scope Withdrawal Time: 0 hours 16 minutes 46 seconds  Total Procedure Duration: 0 hours 19 minutes 44 seconds  Findings:      The perianal and digital rectal examinations were normal.      Five sessile polyps were found in the descending colon and ascending       colon. The polyps were 4 to 6 mm in size. These polyps were removed with       a cold snare. Resection and retrieval were complete. Estimated blood       loss was minimal.      The exam was otherwise without abnormality on direct and retroflexion       views. Impression:               - Five 4 to 6 mm polyps in the descending colon and                            in the ascending colon, removed with a cold snare.                            Resected and retrieved.                           -  The examination was otherwise normal on direct                            and retroflexion views. Moderate Sedation:      Moderate (conscious) sedation was administered by the endoscopy nurse       and supervised by the endoscopist. The following parameters were       monitored: oxygen saturation, heart rate, blood pressure, respiratory       rate, EKG, adequacy of pulmonary ventilation, and response to care.       Total physician intraservice time was 26 minutes. Recommendation:           - Patient has a contact number available for                            emergencies. The  signs and symptoms of potential                            delayed complications were discussed with the                            patient. Return to normal activities tomorrow.                            Written discharge instructions were provided to the                            patient.                           - Resume previous diet.                           - Continue present medications.                           - Repeat colonoscopy date to be determined after                            pending pathology results are reviewed for                            surveillance based on pathology results.                           - Return to GI office in 1 month. Procedure Code(s):        --- Professional ---                           [phone], Colonoscopy, flexible; with removal of                            tumor(s), polyp(s), or other lesion(s) by snare                            technique  74081, Moderate sedation; each additional 15                            minutes intraservice time                           G0500, Moderate sedation services provided by the                            same physician or other qualified health care                            professional performing a gastrointestinal                            endoscopic service that sedation supports,                            requiring the presence of an independent trained                            observer to assist in the monitoring of the                            patient's level of consciousness and physiological                            status; initial 15 minutes of intra-service time;                            patient age 15 years or older (additional time may                            be reported with [phone], as appropriate) Diagnosis Code(s):        --- Professional ---                           Z12.11, Encounter for screening for malignant                            neoplasm  of colon                           K63.5, Polyp of colon CPT copyright 2019 American Medical Association. All rights reserved. The codes documented in this report are preliminary and upon coder review may  be revised to meet current compliance requirements. Cristopher Estimable. Phil Corti, MD Norvel Richards, MD 12/07/2019 11:39:56 AM This report has been signed electronically. Number of Addenda: 0

## 2019-12-07 NOTE — H&P (Signed)
@LOGO @   Primary Care Physician:  Kathyrn Drown, MD Primary Gastroenterologist:  Dr. Gala Romney  Pre-Procedure History & Physical: HPI:  Kenneth Fritz is a 64 y.o. male is here for a screening colonoscopy.  No prior examination.  Has had some chronic lower abdominal pain.  CT negative for anything acute.  No family history of colon cancer.  Past Medical History:  Diagnosis Date  . Cancer ([place]) 11/11   Prostate  . Pernicious anemia     Past Surgical History:  Procedure Laterality Date  . CHOLECYSTECTOMY    . KNEE ARTHROSCOPY Left   . SHOULDER ARTHROSCOPY WITH SUBACROMIAL DECOMPRESSION Left 11/29/2015   Procedure: SHOULDER ARTHROSCOPY WITH SUBACROMIAL DECOMPRESSION;  Surgeon: Carole Civil, MD;  Location: [facility];  Service: Orthopedics;  Laterality: Left;  AND DEBRIDEMENT Pt notified new arrival time 9:45am per KF 10/16  . XI ROBOTIC ASSISTED SIMPLE PROSTATECTOMY     [name]    Prior to Admission medications   Medication Sig Start Date End Date Taking? Authorizing Provider  benzonatate (TESSALON) 100 MG capsule Take 1 capsule (100 mg total) by mouth 2 (two) times daily as needed for cough. Patient taking differently: Take 100 mg by mouth 2 (two) times daily.  11/23/19  Yes Chalmers Guest, NP  cephALEXin (KEFLEX) 500 MG capsule Take 1 capsule (500 mg total) by mouth 2 (two) times daily. 11/25/19  Yes Chalmers Guest, NP  dicyclomine (BENTYL) 10 MG capsule TAKE 1 CAPSULE (10 MG TOTAL) BY MOUTH 3 (THREE) TIMES DAILY AS NEEDED FOR SPASMS (ABDOMINAL PAIN). Patient not taking: Reported on 11/23/2019 10/31/19   Annitta Needs, NP    Allergies as of 10/13/2019  . (No Known Allergies)    Family History  Problem Relation Age of Onset  . Diabetes Mother   . Healthy Father   . Colon cancer Neg Hx   . Gastric cancer Neg Hx   . Esophageal cancer Neg Hx     Social History   Socioeconomic History  . Marital status: Married    Spouse name: Not on file  . Number of children: Not  on file  . Years of education: Not on file  . Highest education level: Not on file  Occupational History  . Not on file  Tobacco Use  . Smoking status: Never Smoker  . Smokeless tobacco: Never Used  Vaping Use  . Vaping Use: Never used  Substance and Sexual Activity  . Alcohol use: No  . Drug use: No  . Sexual activity: Yes    Birth control/protection: None  Other Topics Concern  . Not on file  Social History Narrative  . Not on file   Social Determinants of Health   Financial Resource Strain:   . Difficulty of Paying Living Expenses: Not on file  Food Insecurity:   . Worried About Charity fundraiser in the Last Year: Not on file  . Ran Out of Food in the Last Year: Not on file  Transportation Needs:   . Lack of Transportation (Medical): Not on file  . Lack of Transportation (Non-Medical): Not on file  Physical Activity:   . Days of Exercise per Week: Not on file  . Minutes of Exercise per Session: Not on file  Stress:   . Feeling of Stress : Not on file  Social Connections:   . Frequency of Communication with Friends and Family: Not on file  . Frequency of Social Gatherings with Friends and Family: Not on  file  . Attends Religious Services: Not on file  . Active Member of Clubs or Organizations: Not on file  . Attends Archivist Meetings: Not on file  . Marital Status: Not on file  Intimate Partner Violence:   . Fear of Current or Ex-Partner: Not on file  . Emotionally Abused: Not on file  . Physically Abused: Not on file  . Sexually Abused: Not on file    Review of Systems: See HPI, otherwise negative ROS  Physical Exam: BP 140/85   Pulse 98   Temp 97.6 F (36.4 C) (Oral)   Resp 15   Ht 5\' 9"  (1.753 m)   Wt 74.8 kg   SpO2 98%   BMI 24.37 kg/m  General:   Alert,  Well-developed, well-nourished, pleasant and cooperative in NAD  Neck:  Supple; no masses or thyromegaly. Lungs:  Clear throughout to auscultation.   No wheezes, crackles, or  rhonchi. No acute distress. Heart:  Regular rate and rhythm; no murmurs, clicks, rubs,  or gallops. Abdomen:  Soft, nontender and nondistended. No masses, hepatosplenomegaly or hernias noted. Normal bowel sounds, without guarding, and without rebound.   Msk:  Symmetrical without gross deformities. Normal posture. Pulses:  Normal pulses noted. Extremities:  Without clubbing or edema. Neurologic:  Alert and  oriented x4;  grossly normal neurologically. Skin:  Intact without significant lesions or rashes. Cervical Nodes:  No significant cervical adenopathy. Psych:  Alert and cooperative. Normal mood and affect.  Impression/Plan: Kenneth Fritz is now here to undergo a screening colonoscopy.  Average  risk screening examination.  I have offered the patient a screening colonoscopy.  Risks, benefits, limitations, imponderables and alternatives regarding colonoscopy have been reviewed with the patient. Questions have been answered. All parties agreeable.     Notice:  This dictation was prepared with Dragon dictation along with smaller phrase technology. Any transcriptional errors that result from this process are unintentional and may not be corrected upon review.

## 2019-12-07 NOTE — Discharge Instructions (Signed)
Colonoscopy Discharge Instructions  Read the instructions outlined below and refer to this sheet in the next few weeks. These discharge instructions provide you with general information on caring for yourself after you leave the hospital. Your doctor may also give you specific instructions. While your treatment has been planned according to the most current medical practices available, unavoidable complications occasionally occur. If you have any problems or questions after discharge, call Dr. Gala Romney at [phone]. ACTIVITY  You may resume your regular activity, but move at a slower pace for the next 24 hours.   Take frequent rest periods for the next 24 hours.   Walking will help get rid of the air and reduce the bloated feeling in your belly (abdomen).   No driving for 24 hours (because of the medicine (anesthesia) used during the test).    Do not sign any important legal documents or operate any machinery for 24 hours (because of the anesthesia used during the test).  NUTRITION  Drink plenty of fluids.   You may resume your normal diet as instructed by your doctor.   Begin with a light meal and progress to your normal diet. Heavy or fried foods are harder to digest and may make you feel sick to your stomach (nauseated).   Avoid alcoholic beverages for 24 hours or as instructed.  MEDICATIONS  You may resume your normal medications unless your doctor tells you otherwise.  WHAT YOU CAN EXPECT TODAY  Some feelings of bloating in the abdomen.   Passage of more gas than usual.   Spotting of blood in your stool or on the toilet paper.  IF YOU HAD POLYPS REMOVED DURING THE COLONOSCOPY:  No aspirin products for 7 days or as instructed.   No alcohol for 7 days or as instructed.   Eat a soft diet for the next 24 hours.  FINDING OUT THE RESULTS OF YOUR TEST Not all test results are available during your visit. If your test results are not back during the visit, make an appointment  with your caregiver to find out the results. Do not assume everything is normal if you have not heard from your caregiver or the medical facility. It is important for you to follow up on all of your test results.  SEEK IMMEDIATE MEDICAL ATTENTION IF:  You have more than a spotting of blood in your stool.   Your belly is swollen (abdominal distention).   You are nauseated or vomiting.   You have a temperature over 101.   You have abdominal pain or discomfort that is severe or gets worse throughout the day.   5 polyps removed from your colon today  Further recommendations to follow pending review of pathology report  At patient request, I called Gina at [phone] and left a message on answering service with findings and recommendations  Office visit with [place] [facility]) in 4 weeks   Colon Polyps  Polyps are tissue growths inside the body. Polyps can grow in many places, including the large intestine (colon). A polyp may be a round bump or a mushroom-shaped growth. You could have one polyp or several. Most colon polyps are noncancerous (benign). However, some colon polyps can become cancerous over time. Finding and removing the polyps early can help prevent this. What are the causes? The exact cause of colon polyps is not known. What increases the risk? You are more likely to develop this condition if you:  Have a family history of colon cancer or colon polyps.  Are older than 38 or older than 45 if you are African American.  Have inflammatory bowel disease, such as ulcerative colitis or Crohn's disease.  Have certain hereditary conditions, such as: ? Familial adenomatous polyposis. ? Lynch syndrome. ? Turcot syndrome. ? Peutz-Jeghers syndrome.  Are overweight.  Smoke cigarettes.  Do not get enough exercise.  Drink too much alcohol.  Eat a diet that is high in fat and red meat and low in fiber.  Had childhood cancer that was treated with abdominal radiation. What  are the signs or symptoms? Most polyps do not cause symptoms. If you have symptoms, they may include:  Blood coming from your rectum when having a bowel movement.  Blood in your stool. The stool may look dark red or black.  Abdominal pain.  A change in bowel habits, such as constipation or diarrhea. How is this diagnosed? This condition is diagnosed with a colonoscopy. This is a procedure in which a lighted, flexible scope is inserted into the anus and then passed into the colon to examine the area. Polyps are sometimes found when a colonoscopy is done as part of routine cancer screening tests. How is this treated? Treatment for this condition involves removing any polyps that are found. Most polyps can be removed during a colonoscopy. Those polyps will then be tested for cancer. Additional treatment may be needed depending on the results of testing. Follow these instructions at home: Lifestyle  Maintain a healthy weight, or lose weight if recommended by your health care provider.  Exercise every day or as told by your health care provider.  Do not use any products that contain nicotine or tobacco, such as cigarettes and e-cigarettes. If you need help quitting, ask your health care provider.  If you drink alcohol, limit how much you have: ? 0-1 drink a day for women. ? 0-2 drinks a day for men.  Be aware of how much alcohol is in your drink. In the [place] one drink equals one 12 oz bottle of beer (355 mL), one 5 oz glass of wine (148 mL), or one 1 oz shot of hard liquor (44 mL). Eating and drinking   Eat foods that are high in fiber, such as fruits, vegetables, and whole grains.  Eat foods that are high in calcium and vitamin D, such as milk, cheese, yogurt, eggs, liver, fish, and broccoli.  Limit foods that are high in fat, such as fried foods and desserts.  Limit the amount of red meat and processed meat you eat, such as hot dogs, sausage, bacon, and lunch meats. General  instructions  Keep all follow-up visits as told by your health care provider. This is important. ? This includes having regularly scheduled colonoscopies. ? Talk to your health care provider about when you need a colonoscopy. Contact a health care provider if:  You have new or worsening bleeding during a bowel movement.  You have new or increased blood in your stool.  You have a change in bowel habits.  You lose weight for no known reason. Summary  Polyps are tissue growths inside the body. Polyps can grow in many places, including the colon.  Most colon polyps are noncancerous (benign), but some can become cancerous over time.  This condition is diagnosed with a colonoscopy.  Treatment for this condition involves removing any polyps that are found. Most polyps can be removed during a colonoscopy. This information is not intended to replace advice given to you by your health care provider. Make sure  you discuss any questions you have with your health care provider. Document Revised: 05/14/2017 Document Reviewed: 05/14/2017 Elsevier Patient Education  [place].

## 2019-12-08 ENCOUNTER — Encounter: Payer: Self-pay | Admitting: Internal Medicine

## 2019-12-08 LAB — SURGICAL PATHOLOGY

## 2019-12-12 ENCOUNTER — Encounter (HOSPITAL_COMMUNITY): Payer: Self-pay | Admitting: Internal Medicine

## 2019-12-15 ENCOUNTER — Ambulatory Visit: Payer: 59 | Admitting: Nurse Practitioner

## 2019-12-22 ENCOUNTER — Other Ambulatory Visit (INDEPENDENT_AMBULATORY_CARE_PROVIDER_SITE_OTHER): Payer: 59 | Admitting: *Deleted

## 2019-12-22 ENCOUNTER — Other Ambulatory Visit: Payer: Self-pay

## 2019-12-22 DIAGNOSIS — D510 Vitamin B12 deficiency anemia due to intrinsic factor deficiency: Secondary | ICD-10-CM

## 2019-12-22 MED ORDER — CYANOCOBALAMIN 1000 MCG/ML IJ SOLN
1000.0000 ug | Freq: Once | INTRAMUSCULAR | Status: AC
  Administered 2019-12-22: 1000 ug via INTRAMUSCULAR

## 2020-01-24 ENCOUNTER — Encounter: Payer: Self-pay | Admitting: Nurse Practitioner

## 2020-01-24 ENCOUNTER — Ambulatory Visit (INDEPENDENT_AMBULATORY_CARE_PROVIDER_SITE_OTHER): Payer: 59 | Admitting: Nurse Practitioner

## 2020-01-24 ENCOUNTER — Other Ambulatory Visit: Payer: Self-pay

## 2020-01-24 VITALS — BP 146/84 | HR 75 | Temp 97.7°F | Ht 69.0 in | Wt 162.4 lb

## 2020-01-24 DIAGNOSIS — N3289 Other specified disorders of bladder: Secondary | ICD-10-CM

## 2020-01-24 DIAGNOSIS — R1030 Lower abdominal pain, unspecified: Secondary | ICD-10-CM | POA: Diagnosis not present

## 2020-01-24 DIAGNOSIS — C61 Malignant neoplasm of prostate: Secondary | ICD-10-CM

## 2020-01-24 NOTE — Patient Instructions (Signed)
Your health issues we discussed today were:   Abdominal pain and bladder wall thickening on CT: 1. As we discussed, your bladder could be possibly related to your pain 2. I am glad you are feeling better in this regard! 3. Call us if you have any worsening pain or any new GI symptoms such as constipation, diarrhea, etc. 4. You can discuss with your primary care if it is worth seeing a urologist given your history of prostate cancer to make sure everything looks good  Overall I recommend:  1. Continue your other current medications 2. Return for follow-up as needed 3. Call us for any questions or concerns   ---------------------------------------------------------------  I am glad you have gotten your COVID-19 vaccination!  Even though you are fully vaccinated you should continue to follow CDC and state/local guidelines.  ---------------------------------------------------------------   At [facility] we value your feedback. You may receive a survey about your visit today. Please share your experience as we strive to create trusting relationships with our patients to provide genuine, compassionate, quality care.  We appreciate your understanding and patience as we review any laboratory studies, imaging, and other diagnostic tests that are ordered as we care for you. Our office policy is 5 business days for review of these results, and any emergent or urgent results are addressed in a timely manner for your best interest. If you do not hear from our office in 1 week, please contact us.   We also encourage the use of MyChart, which contains your medical information for your review as well. If you are not enrolled in this feature, an access code is on this after visit summary for your convenience. Thank you for allowing [place] to be involved in your care.  It was great to see you today!  I hope you have a Merry Christmas and Happy Holidays!!

## 2020-01-24 NOTE — Progress Notes (Signed)
Referring Provider: Kathyrn Drown, MD Primary Care Physician:  Kathyrn Drown, MD Primary GI:  Dr. Gala Romney  Chief Complaint  Patient presents with  . Abdominal Pain    Not as bad, lower abd    HPI:   Kenneth Fritz is a 64 y.o. male who presents for abdominal pain and to schedule a colonoscopy.  The patient was last seen in our office 10/13/2019 for lower abdominal pain and to schedule colonoscopy.  Primary care work-up including labs found essentially normal PSA, BMP, HFP, CBC.  Mildly elevated lipase 63.  CT of the abdomen and pelvis in July of this year with posterior bladder wall thickening query cystitis or UTI, ventral abdominal hernia without bowel involvement or inflammation, pancreas look normal.  Liver with multiple lesions felt to be simple cysts.  At his last visit having abdominal pain for the previous 3 months that waxes and wanes in intensity but never fully resolves.  Multiple stools a day consistently [place] 4.  No GERD symptoms.  Pain typically left lower quadrant in radiates to the anterior abdomen.  No improvement after a bowel movement.  Denies urinary symptoms.  Previously scheduled colonoscopy in 2020 was canceled due to COVID-19 pandemic.  No other overt GI complaints.  Recommended dicyclomine 10 mg up to 3 times a day, colonoscopy, follow-up in 2 months.  Colonoscopy was completed 12/07/2019 which found five 4 to 6 mm polyps in the descending and a descending colons, otherwise normal.  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  Recommended repeat colonoscopy in 3 years (2024).  Today he states doing okay overall. Still with some lower abdominal pain, not as bad. Still hurts every now and then (was constant before). He has not seen a urologist. Denies N/V, hematochezia, melena, fever, chills, unintentional weight loss. Denies URI or flu-like symptoms. Denies loss of sense of taste or smell. The patient has received COVID-19 vaccination(s). Denies  chest pain, dyspnea, dizziness, lightheadedness, syncope, near syncope. Denies any other upper or lower GI symptoms.  Not one any medications currently.  Past Medical History:  Diagnosis Date  . Cancer ([place]) 11/11   Prostate  . Pernicious anemia     Past Surgical History:  Procedure Laterality Date  . CHOLECYSTECTOMY    . COLONOSCOPY N/A 12/07/2019   Procedure: COLONOSCOPY;  Surgeon: Daneil Dolin, MD;  Location: [facility];  Service: Endoscopy;  Laterality: N/A;  1:15pm  . KNEE ARTHROSCOPY Left   . POLYPECTOMY  12/07/2019   Procedure: POLYPECTOMY;  Surgeon: Daneil Dolin, MD;  Location: [facility];  Service: Endoscopy;;  . SHOULDER ARTHROSCOPY WITH SUBACROMIAL DECOMPRESSION Left 11/29/2015   Procedure: SHOULDER ARTHROSCOPY WITH SUBACROMIAL DECOMPRESSION;  Surgeon: Carole Civil, MD;  Location: [facility];  Service: Orthopedics;  Laterality: Left;  AND DEBRIDEMENT Pt notified new arrival time 9:45am per KF 10/16  . XI ROBOTIC ASSISTED SIMPLE PROSTATECTOMY     [name]    No current outpatient medications on file.   No current facility-administered medications for this visit.    Allergies as of 01/24/2020  . (No Known Allergies)    Family History  Problem Relation Age of Onset  . Diabetes Mother   . Healthy Father   . Colon cancer Neg Hx   . Gastric cancer Neg Hx   . Esophageal cancer Neg Hx     Social History   Socioeconomic History  . Marital status: Married    Spouse name: Not on  file  . Number of children: Not on file  . Years of education: Not on file  . Highest education level: Not on file  Occupational History  . Not on file  Tobacco Use  . Smoking status: Never Smoker  . Smokeless tobacco: Never Used  Vaping Use  . Vaping Use: Never used  Substance and Sexual Activity  . Alcohol use: No  . Drug use: No  . Sexual activity: Yes    Birth control/protection: None  Other Topics Concern  . Not on file  Social History Narrative  . Not on  file   Social Determinants of Health   Financial Resource Strain: Not on file  Food Insecurity: Not on file  Transportation Needs: Not on file  Physical Activity: Not on file  Stress: Not on file  Social Connections: Not on file    Subjective: Review of Systems  Constitutional: Negative for chills, fever, malaise/fatigue and weight loss.  HENT: Negative for congestion and sore throat.   Respiratory: Negative for cough and shortness of breath.   Cardiovascular: Negative for chest pain and palpitations.  Gastrointestinal: Negative for abdominal pain, blood in stool, constipation, diarrhea, heartburn, melena, nausea and vomiting.  Musculoskeletal: Negative for joint pain and myalgias.  Skin: Negative for rash.  Neurological: Negative for dizziness and weakness.  Endo/Heme/Allergies: Does not bruise/bleed easily.  Psychiatric/Behavioral: Negative for depression. The patient is not nervous/anxious.   All other systems reviewed and are negative.    Objective: BP (!) 146/84   Pulse 75   Temp 97.7 F (36.5 C) (Temporal)   Ht 5\' 9"  (1.753 m)   Wt 162 lb 6.4 oz (73.7 kg)   BMI 23.98 kg/m  Physical Exam Vitals and nursing note reviewed.  Constitutional:      General: He is not in acute distress.    Appearance: Normal appearance. He is normal weight. He is not ill-appearing, toxic-appearing or diaphoretic.  HENT:     Head: Normocephalic and atraumatic.     Nose: No congestion or rhinorrhea.  Eyes:     General: No scleral icterus. Cardiovascular:     Rate and Rhythm: Normal rate and regular rhythm.     Heart sounds: Normal heart sounds.  Pulmonary:     Effort: Pulmonary effort is normal.     Breath sounds: Normal breath sounds.  Abdominal:     General: Bowel sounds are normal. There is no distension.     Palpations: Abdomen is soft. There is no hepatomegaly, splenomegaly or mass.     Tenderness: There is no abdominal tenderness. There is no guarding or rebound.     Hernia:  No hernia is present.  Musculoskeletal:     Cervical back: Neck supple.  Skin:    General: Skin is warm and dry.     Coloration: Skin is not jaundiced.     Findings: No bruising or rash.  Neurological:     General: No focal deficit present.     Mental Status: He is alert and oriented to person, place, and time. Mental status is at baseline.  Psychiatric:        Mood and Affect: Mood normal.        Behavior: Behavior normal.        Thought Content: Thought content normal.      Assessment:  Very pleasant 64 year old male presents for follow-up on lower abdominal pain and for follow-up on colonoscopy.  As noted in HPI he did have some bladder wall thickening noted on  CT.  He has not seen a urologist.  He completed a colonoscopy which found 3 moderate-sized polyps with recommended repeat in 3 years.  He is on recall for this.  He states his abdominal pain is significantly better since having his colonoscopy, not sure if it is from the colonoscopy or just coincidence.  We did discuss the abnormal bladder wall thickening with a history of prostate cancer.  I recommend he discuss with his primary care for feel a urology evaluation is justified.  Otherwise, occasional/rare pain which is again lower abdomen and could be related to his bladder.  He does deny urinary symptoms again today.  We will have him monitor for worsening and follow-up as needed.   Plan: 1. Follow-up with primary care for possible urology evaluation if deemed necessary 2. Call for any worsening symptoms in the interim or new symptoms 3. Follow-up as needed    Thank you for allowing [place] to participate in the care of Kenneth Fritz  Walden Field, DNP, AGNP-C Adult & Gerontological Nurse Practitioner [facility]   01/24/2020 4:08 PM   Disclaimer: This note was dictated with voice recognition software. Similar sounding words can inadvertently be transcribed and may not be corrected upon  review.

## 2020-02-23 ENCOUNTER — Other Ambulatory Visit: Payer: Self-pay

## 2020-02-23 ENCOUNTER — Other Ambulatory Visit: Payer: 59

## 2020-02-23 DIAGNOSIS — Z20822 Contact with and (suspected) exposure to covid-19: Secondary | ICD-10-CM

## 2020-02-25 LAB — SARS-COV-2, NAA 2 DAY TAT

## 2020-02-25 LAB — NOVEL CORONAVIRUS, NAA: SARS-CoV-2, NAA: DETECTED — AB

## 2020-02-27 ENCOUNTER — Telehealth: Payer: Self-pay | Admitting: Infectious Diseases

## 2020-02-27 NOTE — Telephone Encounter (Signed)
Called to discuss with patient about COVID-19 symptoms and the use of one of the available treatments for those with mild to moderate Covid symptoms and at a high risk of hospitalization.  Pt appears to qualify for outpatient treatment due to co-morbid conditions and/or a member of an at-risk group in accordance with the FDA Emergency Use Authorization.    Symptom onset: 1/12  Vaccinated: 06/2019 last dose  Booster? No  Immunocompromised?  Qualifiers: hx prostate cancer, incompletely vaccinated.    Janene Madeira, NP

## 2020-02-28 ENCOUNTER — Other Ambulatory Visit (HOSPITAL_COMMUNITY): Payer: Self-pay | Admitting: Internal Medicine

## 2020-02-28 ENCOUNTER — Ambulatory Visit (HOSPITAL_COMMUNITY)
Admission: RE | Admit: 2020-02-28 | Discharge: 2020-02-28 | Disposition: A | Discharge status: Home / Self Care | Payer: 59 | Source: Ambulatory Visit | Attending: Pulmonary Disease | Admitting: Pulmonary Disease

## 2020-02-28 DIAGNOSIS — U071 COVID-19: Secondary | ICD-10-CM

## 2020-02-28 MED ORDER — SODIUM CHLORIDE 0.9 % IV SOLN: INTRAVENOUS | Status: DC | PRN

## 2020-02-28 MED ORDER — METHYLPREDNISOLONE SODIUM SUCC 125 MG IJ SOLR: 125.0000 mg | Freq: Once | INTRAMUSCULAR | Status: DC | PRN

## 2020-02-28 MED ORDER — SOTROVIMAB 500 MG/8ML IV SOLN
500.0000 mg | Freq: Once | INTRAVENOUS | Status: AC
  Administered 2020-02-28: 500 mg via INTRAVENOUS

## 2020-02-28 MED ORDER — FAMOTIDINE IN NACL 20-0.9 MG/50ML-% IV SOLN: 20.0000 mg | Freq: Once | INTRAVENOUS | Status: DC | PRN

## 2020-02-28 MED ORDER — EPINEPHRINE 0.3 MG/0.3ML IJ SOAJ: 0.3000 mg | Freq: Once | INTRAMUSCULAR | Status: DC | PRN

## 2020-02-28 MED ORDER — DIPHENHYDRAMINE HCL 50 MG/ML IJ SOLN: 50.0000 mg | Freq: Once | INTRAMUSCULAR | Status: DC | PRN

## 2020-02-28 MED ORDER — ALBUTEROL SULFATE HFA 108 (90 BASE) MCG/ACT IN AERS: 2.0000 | INHALATION_SPRAY | Freq: Once | RESPIRATORY_TRACT | Status: DC | PRN

## 2020-02-28 NOTE — Progress Notes (Signed)
Diagnosis: COVID-19  Physician: Dr. Patrick Wright  Procedure: [facility] Med: Sotrovimab infusion - Provided patient with sotrovimab fact sheet for patients, parents, and caregivers prior to infusion.   Complications: No immediate complications noted  Discharge: Discharged home    

## 2020-02-28 NOTE — Discharge Instructions (Signed)

## 2020-02-28 NOTE — Progress Notes (Signed)
Patient reviewed Fact Sheet for Patients, Parents, and Caregivers for Emergency Use Authorization (EUA) of sotrovimab for the Treatment of Coronavirus. Patient also reviewed and is agreeable to the estimated cost of treatment. Patient is agreeable to proceed.   

## 2020-06-13 ENCOUNTER — Other Ambulatory Visit: Payer: Self-pay

## 2020-06-13 ENCOUNTER — Other Ambulatory Visit (INDEPENDENT_AMBULATORY_CARE_PROVIDER_SITE_OTHER): Payer: 59

## 2020-06-13 DIAGNOSIS — D510 Vitamin B12 deficiency anemia due to intrinsic factor deficiency: Secondary | ICD-10-CM | POA: Diagnosis not present

## 2020-06-13 MED ORDER — CYANOCOBALAMIN 1000 MCG/ML IJ SOLN: 1000.0000 ug | Freq: Once | INTRAMUSCULAR | 0 refills | Status: DC

## 2020-06-13 MED ORDER — CYANOCOBALAMIN 1000 MCG/ML IJ SOLN
1000.0000 ug | Freq: Once | INTRAMUSCULAR | Status: AC
  Administered 2020-06-13: 1000 ug via INTRAMUSCULAR

## 2020-06-13 NOTE — Addendum Note (Signed)
Addended by: Anastasio Auerbach R on: 06/13/2020 03:07 PM   Modules accepted: Orders

## 2020-06-13 NOTE — Progress Notes (Signed)
Patient is here for B12 injection in R deltoid , pt tolerated it well .

## 2020-08-10 ENCOUNTER — Other Ambulatory Visit: Payer: 59

## 2020-08-14 ENCOUNTER — Telehealth: Payer: Self-pay | Admitting: *Deleted

## 2020-08-14 ENCOUNTER — Other Ambulatory Visit: Payer: Self-pay

## 2020-08-14 ENCOUNTER — Other Ambulatory Visit (INDEPENDENT_AMBULATORY_CARE_PROVIDER_SITE_OTHER): Payer: 59 | Admitting: *Deleted

## 2020-08-14 ENCOUNTER — Other Ambulatory Visit: Payer: Self-pay | Admitting: *Deleted

## 2020-08-14 DIAGNOSIS — E7849 Other hyperlipidemia: Secondary | ICD-10-CM

## 2020-08-14 DIAGNOSIS — D510 Vitamin B12 deficiency anemia due to intrinsic factor deficiency: Secondary | ICD-10-CM

## 2020-08-14 DIAGNOSIS — Z8546 Personal history of malignant neoplasm of prostate: Secondary | ICD-10-CM

## 2020-08-14 DIAGNOSIS — Z79899 Other long term (current) drug therapy: Secondary | ICD-10-CM

## 2020-08-14 DIAGNOSIS — Z125 Encounter for screening for malignant neoplasm of prostate: Secondary | ICD-10-CM

## 2020-08-14 MED ORDER — CYANOCOBALAMIN 1000 MCG/ML IJ SOLN
1000.0000 ug | Freq: Once | INTRAMUSCULAR | Status: AC
  Administered 2020-08-14: 1000 ug via INTRAMUSCULAR

## 2020-08-14 NOTE — Telephone Encounter (Signed)
Pt here today for b12 injection with nurse. Last labs for b12 was 04/05/18.  I told pt while here and put in check out note that he needs visit with dr Nicki Reaper. Last labs 04/05/18 lipid, cbc, hep c, hiv, b12

## 2020-08-14 NOTE — Telephone Encounter (Signed)
Recommend CBC, vitamin B12, lipid, liver, metabolic 7, PSA Screening PSA Vitamin D deficiency Hyperlipidemia And it will be wise for patient to do follow-up office visit

## 2020-08-14 NOTE — Telephone Encounter (Signed)
Discussed with pt and bw orders put in.

## 2020-08-23 NOTE — Telephone Encounter (Signed)
error 

## 2020-09-18 LAB — CBC WITH DIFFERENTIAL/PLATELET
Basophils Absolute: 0.1 10*3/uL (ref 0.0–0.2)
Basos: 1 %
EOS (ABSOLUTE): 0.1 10*3/uL (ref 0.0–0.4)
Eos: 1 %
Hematocrit: 41.7 % (ref 37.5–51.0)
Hemoglobin: 14.3 g/dL (ref 13.0–17.7)
Immature Grans (Abs): 0 10*3/uL (ref 0.0–0.1)
Immature Granulocytes: 0 %
Lymphocytes Absolute: 1.6 10*3/uL (ref 0.7–3.1)
Lymphs: 27 %
MCH: 30.9 pg (ref 26.6–33.0)
MCHC: 34.3 g/dL (ref 31.5–35.7)
MCV: 90 fL (ref 79–97)
Monocytes Absolute: 0.5 10*3/uL (ref 0.1–0.9)
Monocytes: 8 %
Neutrophils Absolute: 3.5 10*3/uL (ref 1.4–7.0)
Neutrophils: 63 %
Platelets: 318 10*3/uL (ref 150–450)
RBC: 4.63 x10E6/uL (ref 4.14–5.80)
RDW: 13.1 % (ref 11.6–15.4)
WBC: 5.7 10*3/uL (ref 3.4–10.8)

## 2020-09-18 LAB — HEPATIC FUNCTION PANEL
ALT: 10 IU/L (ref 0–44)
AST: 12 IU/L (ref 0–40)
Albumin: 4.2 g/dL (ref 3.8–4.8)
Alkaline Phosphatase: 94 IU/L (ref 44–121)
Bilirubin Total: 0.5 mg/dL (ref 0.0–1.2)
Bilirubin, Direct: 0.11 mg/dL (ref 0.00–0.40)
Total Protein: 6.5 g/dL (ref 6.0–8.5)

## 2020-09-18 LAB — LIPID PANEL
Chol/HDL Ratio: 5.3 ratio — ABNORMAL HIGH (ref 0.0–5.0)
Cholesterol, Total: 191 mg/dL (ref 100–199)
HDL: 36 mg/dL — ABNORMAL LOW (ref >39)
LDL Chol Calc (NIH): 135 mg/dL — ABNORMAL HIGH (ref 0–99)
Triglycerides: 107 mg/dL (ref 0–149)
VLDL Cholesterol Cal: 20 mg/dL (ref 5–40)

## 2020-09-18 LAB — BASIC METABOLIC PANEL
BUN/Creatinine Ratio: 12 (ref 10–24)
BUN: 15 mg/dL (ref 8–27)
CO2: 23 mmol/L (ref 20–29)
Calcium: 9.3 mg/dL (ref 8.6–10.2)
Chloride: 101 mmol/L (ref 96–106)
Creatinine, Ser: 1.25 mg/dL (ref 0.76–1.27)
Glucose: 117 mg/dL — ABNORMAL HIGH (ref 65–99)
Potassium: 4 mmol/L (ref 3.5–5.2)
Sodium: 140 mmol/L (ref 134–144)
eGFR: 64 mL/min/{1.73_m2} (ref >59)

## 2020-09-18 LAB — PSA: Prostate Specific Ag, Serum: <0.1 ng/mL (ref 0.0–4.0)

## 2020-09-18 LAB — VITAMIN B12: Vitamin B-12: 345 pg/mL (ref 232–1245)

## 2020-09-21 ENCOUNTER — Other Ambulatory Visit: Payer: Self-pay

## 2020-09-21 ENCOUNTER — Other Ambulatory Visit (INDEPENDENT_AMBULATORY_CARE_PROVIDER_SITE_OTHER): Payer: 59

## 2020-09-21 DIAGNOSIS — D510 Vitamin B12 deficiency anemia due to intrinsic factor deficiency: Secondary | ICD-10-CM

## 2020-09-21 MED ORDER — CYANOCOBALAMIN 1000 MCG/ML IJ SOLN
1000.0000 ug | Freq: Once | INTRAMUSCULAR | Status: AC
  Administered 2020-09-21: 1000 ug via INTRAMUSCULAR

## 2020-10-08 ENCOUNTER — Other Ambulatory Visit: Payer: Self-pay

## 2020-10-08 ENCOUNTER — Encounter: Payer: Self-pay | Admitting: Family Medicine

## 2020-10-08 ENCOUNTER — Ambulatory Visit: Payer: 59 | Admitting: Family Medicine

## 2020-10-08 VITALS — BP 120/68 | Temp 97.2°F | Wt 165.4 lb

## 2020-10-08 DIAGNOSIS — D510 Vitamin B12 deficiency anemia due to intrinsic factor deficiency: Secondary | ICD-10-CM

## 2020-10-08 NOTE — Patient Instructions (Addendum)
Shingrix and shingles prevention: know the facts!   Shingrix is a very effective vaccine to prevent shingles.   Shingles is a reactivation of chickenpox -more than 99% of Americans born before 1980 have had chickenpox even if they do not remember it. One in every 10 people who get shingles have severe long-lasting nerve pain as a result.   33 out of a 100 older adults will get shingles if they are unvaccinated.     This vaccine is very important for your health This vaccine is indicated for anyone 50 years or older. You can get this vaccine even if you have already had shingles because you can get the disease more than once in a lifetime.  Your risk for shingles and its complications increases with age.  This vaccine has 2 doses.  The second dose would be 2 to 6 months after the first dose.  If you had Zostavax vaccine in the past you should still get Shingrix. ( Zostavax is only 70% effective and it loses significant strength over a few years .)  This vaccine is given through the pharmacy.  The cost of the vaccine is through your insurance. The pharmacy can inform you of the total costs.  Common side effects including soreness in the arm, some redness and swelling, also some feel fatigue muscle soreness headache low-grade fever.  Side effects typically go away within 2 to 3 days. Remember-the pain from shingles can last a lifetime but these side effects of the vaccine will only last a few days at most. It is very important to get both doses in order to protect yourself fully.   Please get this vaccine at your earliest convenience at your trusted pharmacy.  Please make sure to do your monthly B12 shots Follow-up in 1 year  If you are interested in checking your cholesterol in 6 months please let us know   Results for orders placed or performed in visit on 08/14/20  Lipid panel  Result Value Ref Range   Cholesterol, Total 191 100 - 199 mg/dL   Triglycerides 107 0 - 149 mg/dL    HDL 36 (L) >39 mg/dL   VLDL Cholesterol Cal 20 5 - 40 mg/dL   LDL Chol Calc (NIH) 135 (H) 0 - 99 mg/dL   Chol/HDL Ratio 5.3 (H) 0.0 - 5.0 ratio  Basic metabolic panel  Result Value Ref Range   Glucose 117 (H) 65 - 99 mg/dL   BUN 15 8 - 27 mg/dL   Creatinine, Ser 1.25 0.76 - 1.27 mg/dL   eGFR 64 >59 mL/min/1.73   BUN/Creatinine Ratio 12 10 - 24   Sodium 140 134 - 144 mmol/L   Potassium 4.0 3.5 - 5.2 mmol/L   Chloride 101 96 - 106 mmol/L   CO2 23 20 - 29 mmol/L   Calcium 9.3 8.6 - 10.2 mg/dL  Vitamin B12  Result Value Ref Range   Vitamin B-12 345 232 - 1,245 pg/mL  Hepatic function panel  Result Value Ref Range   Total Protein 6.5 6.0 - 8.5 g/dL   Albumin 4.2 3.8 - 4.8 g/dL   Bilirubin Total 0.5 0.0 - 1.2 mg/dL   Bilirubin, Direct 0.11 0.00 - 0.40 mg/dL   Alkaline Phosphatase 94 44 - 121 IU/L   AST 12 0 - 40 IU/L   ALT 10 0 - 44 IU/L  PSA  Result Value Ref Range   Prostate Specific Ag, Serum <0.1 0.0 - 4.0 ng/mL  CBC with Differential/Platelet  Result  Value Ref Range   WBC 5.7 3.4 - 10.8 x10E3/uL   RBC 4.63 4.14 - 5.80 x10E6/uL   Hemoglobin 14.3 13.0 - 17.7 g/dL   Hematocrit 41.7 37.5 - 51.0 %   MCV 90 79 - 97 fL   MCH 30.9 26.6 - 33.0 pg   MCHC 34.3 31.5 - 35.7 g/dL   RDW 13.1 11.6 - 15.4 %   Platelets 318 150 - 450 x10E3/uL   Neutrophils 63 Not Estab. %   Lymphs 27 Not Estab. %   Monocytes 8 Not Estab. %   Eos 1 Not Estab. %   Basos 1 Not Estab. %   Neutrophils Absolute 3.5 1.4 - 7.0 x10E3/uL   Lymphocytes Absolute 1.6 0.7 - 3.1 x10E3/uL   Monocytes Absolute 0.5 0.1 - 0.9 x10E3/uL   EOS (ABSOLUTE) 0.1 0.0 - 0.4 x10E3/uL   Basophils Absolute 0.1 0.0 - 0.2 x10E3/uL   Immature Granulocytes 0 Not Estab. %   Immature Grans (Abs) 0.0 0.0 - 0.1 x10E3/uL

## 2020-10-08 NOTE — Progress Notes (Signed)
Subjective:    Patient ID: Kenneth Fritz, male    DOB: [birth year], 64 y.o.   MRN: [id number]  HPI Pt here to discuss recent labs. Pt has labs completed on 09/17/20.  We did discuss his labs in detail Is not anemic kidney function liver function look good Cholesterol mild elevation 10 to 12% risk of heart disease in the next 10 years Patient defers on statins Patient states he could do better eating healthier Denies being depressed or stressed Overall energy level good  Next colonoscopy 2024 Shingles vaccine recommended Pneumonia vaccine recommended when he turns 69 in November Results for orders placed or performed in visit on 08/14/20  Lipid panel  Result Value Ref Range   Cholesterol, Total 191 100 - 199 mg/dL   Triglycerides 107 0 - 149 mg/dL   HDL 36 (L) >39 mg/dL   VLDL Cholesterol Cal 20 5 - 40 mg/dL   LDL Chol Calc (NIH) 135 (H) 0 - 99 mg/dL   Chol/HDL Ratio 5.3 (H) 0.0 - 5.0 ratio  Basic metabolic panel  Result Value Ref Range   Glucose 117 (H) 65 - 99 mg/dL   BUN 15 8 - 27 mg/dL   Creatinine, Ser 1.25 0.76 - 1.27 mg/dL   eGFR 64 >59 mL/min/1.73   BUN/Creatinine Ratio 12 10 - 24   Sodium 140 134 - 144 mmol/L   Potassium 4.0 3.5 - 5.2 mmol/L   Chloride 101 96 - 106 mmol/L   CO2 23 20 - 29 mmol/L   Calcium 9.3 8.6 - 10.2 mg/dL  Vitamin B12  Result Value Ref Range   Vitamin B-12 345 232 - 1,245 pg/mL  Hepatic function panel  Result Value Ref Range   Total Protein 6.5 6.0 - 8.5 g/dL   Albumin 4.2 3.8 - 4.8 g/dL   Bilirubin Total 0.5 0.0 - 1.2 mg/dL   Bilirubin, Direct 0.11 0.00 - 0.40 mg/dL   Alkaline Phosphatase 94 44 - 121 IU/L   AST 12 0 - 40 IU/L   ALT 10 0 - 44 IU/L  PSA  Result Value Ref Range   Prostate Specific Ag, Serum <0.1 0.0 - 4.0 ng/mL  CBC with Differential/Platelet  Result Value Ref Range   WBC 5.7 3.4 - 10.8 x10E3/uL   RBC 4.63 4.14 - 5.80 x10E6/uL   Hemoglobin 14.3 13.0 - 17.7 g/dL   Hematocrit 41.7 37.5 - 51.0 %   MCV 90 79 - 97  fL   MCH 30.9 26.6 - 33.0 pg   MCHC 34.3 31.5 - 35.7 g/dL   RDW 13.1 11.6 - 15.4 %   Platelets 318 150 - 450 x10E3/uL   Neutrophils 63 Not Estab. %   Lymphs 27 Not Estab. %   Monocytes 8 Not Estab. %   Eos 1 Not Estab. %   Basos 1 Not Estab. %   Neutrophils Absolute 3.5 1.4 - 7.0 x10E3/uL   Lymphocytes Absolute 1.6 0.7 - 3.1 x10E3/uL   Monocytes Absolute 0.5 0.1 - 0.9 x10E3/uL   EOS (ABSOLUTE) 0.1 0.0 - 0.4 x10E3/uL   Basophils Absolute 0.1 0.0 - 0.2 x10E3/uL   Immature Granulocytes 0 Not Estab. %   Immature Grans (Abs) 0.0 0.0 - 0.1 x10E3/uL     Review of Systems     Objective:   Physical Exam General-in no acute distress Eyes-no discharge Lungs-respiratory rate normal, CTA CV-no murmurs,RRR Extremities skin warm dry no edema Neuro grossly normal Behavior normal, alert        Assessment &  Plan:  B12 deficiency Patient currently doing a fair job of doing his monthly B12 the importance of this was stressed.  Follow-up again in 1 year.  Healthy diet recommended Mild elevation of cholesterol He is at increased risk of heart disease greater than 10% risk in the next 10 years patient defers on statin states he will work hard on diet  Follow-up in 1 years time follow-up sooner problems

## 2020-11-08 ENCOUNTER — Ambulatory Visit: Payer: 59

## 2020-11-08 ENCOUNTER — Other Ambulatory Visit: Payer: Self-pay

## 2020-11-08 ENCOUNTER — Ambulatory Visit (INDEPENDENT_AMBULATORY_CARE_PROVIDER_SITE_OTHER): Payer: 59 | Admitting: Orthopedic Surgery

## 2020-11-08 ENCOUNTER — Encounter: Payer: Self-pay | Admitting: Orthopedic Surgery

## 2020-11-08 VITALS — BP 141/87 | HR 66 | Ht 69.0 in | Wt 160.0 lb

## 2020-11-08 DIAGNOSIS — M79671 Pain in right foot: Secondary | ICD-10-CM | POA: Diagnosis not present

## 2020-11-08 DIAGNOSIS — R937 Abnormal findings on diagnostic imaging of other parts of musculoskeletal system: Secondary | ICD-10-CM

## 2020-11-08 MED ORDER — DICLOFENAC POTASSIUM 50 MG PO TABS: 50.0000 mg | ORAL_TABLET | Freq: Two times a day (BID) | ORAL | 3 refills | Status: DC

## 2020-11-08 NOTE — Progress Notes (Signed)
Chief Complaint  Patient presents with   Foot Pain    Right for years pain top of foot comes and goes    65 year old male comes in with history of atraumatic pain on the dorsum of the right foot which has increased and decreased over the years but has become more frequent and more persistent over the last week or 2.  He denies any trauma and denies any previous surgery to the right foot  Review of systems this is not associated with any numbness tingling radicular symptoms fever chills rash or ulceration  No history of diabetes or radicular back pain  BP (!) 141/87   Pulse 66   Ht 5\' 9"  (1.753 m)   Wt 160 lb (72.6 kg)   BMI 23.63 kg/m   General normal appearance small frame ectomorphic body habitus no developmental abnormalities  Vascular normal pulse perfusion temperature and color  Sensory exam normal in the foot  Musculoskeletal tenderness in the midfoot no tenderness over the tibial posterior tendon  Normal range of motion of the foot and ankle with no instability no atrophy  Images of the foot standing ` fairly normal-appearing foot however 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   1. I will recommend orthotics for the foot   2. and medication for the pain   3. and then a CT scan with and without contrast to evaluate the distal tibia lesion  Meds ordered this encounter  Medications   diclofenac (CATAFLAM) 50 MG tablet    Sig: Take 1 tablet (50 mg total) by mouth 2 (two) times daily.    Dispense:  60 tablet    Refill:  3

## 2020-11-08 NOTE — Patient Instructions (Signed)
While we are working on your approval for CT scan please go ahead and call to schedule your appointment with [facility] within at least one (1) week.   Central Scheduling [phone]

## 2020-11-15 ENCOUNTER — Telehealth: Payer: Self-pay | Admitting: Radiology

## 2020-11-15 NOTE — Telephone Encounter (Signed)
[employer] denied the CT scan for the lesion in his ankle I have not seen the denial. When I see it I will let you know why

## 2020-11-16 ENCOUNTER — Encounter: Payer: Self-pay | Admitting: Family Medicine

## 2020-11-19 MED ORDER — CYANOCOBALAMIN 1000 MCG/ML IJ SOLN: INTRAMUSCULAR | 0 refills | Status: DC

## 2020-11-19 NOTE — Telephone Encounter (Signed)
Patient may have a prescription sent in for eval of B12 1 mL monthly, along with a prescription for the syringes and needles, he should do a follow-up visit by spring time to check levels

## 2020-11-19 NOTE — Telephone Encounter (Signed)
Abby just looked it up and they denied it b/c it was ordered with contrast, a non contrast CT would be covered, do  you want to change?

## 2020-11-19 NOTE — Addendum Note (Signed)
Addended by: Vicente Males on: 11/19/2020 01:20 PM   Modules accepted: Orders

## 2020-11-19 NOTE — Telephone Encounter (Signed)
yes

## 2020-11-19 NOTE — Telephone Encounter (Signed)
[employer] has not given a reason for the denial  Nor have they offered for [place] to do a peer to peer  Do you want me to go ahead and refer him to ortho oncology at [facility] they may be able to do Biopsy since CT denied.

## 2020-11-28 ENCOUNTER — Encounter: Payer: Self-pay | Admitting: Orthopedic Surgery

## 2020-12-11 ENCOUNTER — Other Ambulatory Visit: Payer: Self-pay | Admitting: Family Medicine

## 2021-06-19 ENCOUNTER — Emergency Department (HOSPITAL_COMMUNITY)
Admission: EM | Admit: 2021-06-19 | Discharge: 2021-06-19 | Disposition: A | Discharge status: Home / Self Care | Payer: 59 | Attending: Emergency Medicine | Admitting: Emergency Medicine

## 2021-06-19 ENCOUNTER — Emergency Department (HOSPITAL_COMMUNITY): Payer: 59

## 2021-06-19 ENCOUNTER — Other Ambulatory Visit: Payer: Self-pay

## 2021-06-19 ENCOUNTER — Encounter (HOSPITAL_COMMUNITY): Payer: Self-pay

## 2021-06-19 DIAGNOSIS — R791 Abnormal coagulation profile: Secondary | ICD-10-CM | POA: Insufficient documentation

## 2021-06-19 DIAGNOSIS — R42 Dizziness and giddiness: Secondary | ICD-10-CM | POA: Insufficient documentation

## 2021-06-19 DIAGNOSIS — R519 Headache, unspecified: Secondary | ICD-10-CM | POA: Insufficient documentation

## 2021-06-19 DIAGNOSIS — R410 Disorientation, unspecified: Secondary | ICD-10-CM | POA: Insufficient documentation

## 2021-06-19 LAB — CBC WITH DIFFERENTIAL/PLATELET
Abs Immature Granulocytes: 0.01 10*3/uL (ref 0.00–0.07)
Basophils Absolute: 0 10*3/uL (ref 0.0–0.1)
Basophils Relative: 0 %
Eosinophils Absolute: 0.1 10*3/uL (ref 0.0–0.5)
Eosinophils Relative: 1 %
HCT: 43.8 % (ref 39.0–52.0)
Hemoglobin: 15.1 g/dL (ref 13.0–17.0)
Immature Granulocytes: 0 %
Lymphocytes Relative: 22 %
Lymphs Abs: 1.4 10*3/uL (ref 0.7–4.0)
MCH: 30.3 pg (ref 26.0–34.0)
MCHC: 34.5 g/dL (ref 30.0–36.0)
MCV: 87.8 fL (ref 80.0–100.0)
Monocytes Absolute: 0.6 10*3/uL (ref 0.1–1.0)
Monocytes Relative: 8 %
Neutro Abs: 4.6 10*3/uL (ref 1.7–7.7)
Neutrophils Relative %: 69 %
Platelets: 308 10*3/uL (ref 150–400)
RBC: 4.99 MIL/uL (ref 4.22–5.81)
RDW: 12.6 % (ref 11.5–15.5)
WBC: 6.7 10*3/uL (ref 4.0–10.5)
nRBC: 0 % (ref 0.0–0.2)

## 2021-06-19 LAB — BASIC METABOLIC PANEL
Anion gap: 8 (ref 5–15)
BUN: 18 mg/dL (ref 8–23)
CO2: 26 mmol/L (ref 22–32)
Calcium: 8.9 mg/dL (ref 8.9–10.3)
Chloride: 104 mmol/L (ref 98–111)
Creatinine, Ser: 1.25 mg/dL — ABNORMAL HIGH (ref 0.61–1.24)
GFR, Estimated: >60 mL/min (ref >60)
Glucose, Bld: 114 mg/dL — ABNORMAL HIGH (ref 70–99)
Potassium: 4.1 mmol/L (ref 3.5–5.1)
Sodium: 138 mmol/L (ref 135–145)

## 2021-06-19 LAB — PROTIME-INR
INR: 1.1 (ref 0.8–1.2)
Prothrombin Time: 14.1 seconds (ref 11.4–15.2)

## 2021-06-19 MED ORDER — KETOROLAC TROMETHAMINE 30 MG/ML IJ SOLN
15.0000 mg | Freq: Once | INTRAMUSCULAR | Status: AC
  Administered 2021-06-19: 15 mg via INTRAVENOUS
  Filled 2021-06-19: qty 1

## 2021-06-19 MED ORDER — IOHEXOL 350 MG/ML SOLN
75.0000 mL | Freq: Once | INTRAVENOUS | Status: AC | PRN
  Administered 2021-06-19: 75 mL via INTRAVENOUS

## 2021-06-19 MED ORDER — METOCLOPRAMIDE HCL 5 MG/ML IJ SOLN
10.0000 mg | Freq: Once | INTRAMUSCULAR | Status: AC
  Administered 2021-06-19: 10 mg via INTRAVENOUS
  Filled 2021-06-19: qty 2

## 2021-06-19 MED ORDER — MAGNESIUM SULFATE 2 GM/50ML IV SOLN
2.0000 g | Freq: Once | INTRAVENOUS | Status: AC
  Administered 2021-06-19: 2 g via INTRAVENOUS
  Filled 2021-06-19: qty 50

## 2021-06-19 MED ORDER — SODIUM CHLORIDE 0.9 % IV BOLUS
1000.0000 mL | Freq: Once | INTRAVENOUS | Status: AC
  Administered 2021-06-19: 1000 mL via INTRAVENOUS

## 2021-06-19 MED ORDER — CYCLOBENZAPRINE HCL 10 MG PO TABS
5.0000 mg | ORAL_TABLET | Freq: Once | ORAL | Status: AC
  Administered 2021-06-19: 5 mg via ORAL
  Filled 2021-06-19: qty 1

## 2021-06-19 MED ORDER — DIPHENHYDRAMINE HCL 50 MG/ML IJ SOLN
12.5000 mg | Freq: Once | INTRAMUSCULAR | Status: AC
Start: 2021-06-19 — End: 2021-06-19
  Administered 2021-06-19: 12.5 mg via INTRAVENOUS
  Filled 2021-06-19: qty 1

## 2021-06-19 NOTE — Discharge Instructions (Signed)
Lab work imaging were reassuring, possible this is a migraine, please stay hydrated, decrease in stress, get plenty of sleep this is can help decrease frequency of migraines.  May use over-the-counter pain medication as needed. ? ?Please follow your PCP and/or neurology for further evaluation. ? ?Come back to the emergency department if you develop chest pain, shortness of breath, severe abdominal pain, uncontrolled nausea, vomiting, diarrhea. ? ?

## 2021-06-19 NOTE — ED Triage Notes (Signed)
Patient with complaints of severe right sided headache confusion, and speech difficulties that occurred the previous night around 1900.  ?

## 2021-06-19 NOTE — ED Provider Notes (Signed)
?[name] ?Provider Note ? ? ?CSN: [id number] ?Arrival date & time: 06/19/21  0849 ? ?  ? ?History ? ?Chief Complaint  ?Patient presents with  ? Headache  ? ? ?Kenneth Fritz is a 66 y.o. male. ? ?HPI ? ?Patient without significant medical history presenting from home with a severe headache.  Patient states headache started around 7 PM last night, states it was sudden onset, describes as a throbbing sensation on the left frontal aspect of his head, states the headache has remained constant, he states he had slight blurred vision which has remained slightly obscured, he denies any paresthesias or weakness in upper or lower extremities.  He states that he felt slightly confused as if he cannot get all of his sentences out.  He notes he feels slightly lightheaded and dizzy especially and goes from a sitting to standing position, he denies any nausea vomiting or feeling off balance he denies any neck pain, states he has never had this past, no recent head trauma, not on anticoag's.  He has no history of atrial fibrillation, no recent long immobilization no calf tenderness chest pain shortness of breath, there is no history of diabetes, hypertension, does not smoke, he has never had a headache like this in the past.  He has no other complaints. ? ?Patient's wife is at bedside able to validate the story, she states that patient had a headache that came on suddenly, states that he was having difficulty getting the words out and forming sentences, she states this morning seems like he was misusing his words, but states that he has been here in the emergency department his speech at all that seems normal for him.  She denies any unilateral weakness difficulty walking or recent falls. ? ?Home Medications ?Prior to Admission medications   ?Medication Sig Start Date End Date Taking? Authorizing Provider  ?cyanocobalamin (,VITAMIN B-12,) 1000 MCG/ML injection INJECT 1 ML INTO SKIN ONCE PER MONTH ?Patient  taking differently: Inject 1,000 mcg into the muscle every 30 (thirty) days. 12/11/20  Yes Kathyrn Drown, MD  ?diclofenac (CATAFLAM) 50 MG tablet Take 1 tablet (50 mg total) by mouth 2 (two) times daily. ?Patient not taking: Reported on 06/19/2021 11/08/20   Carole Civil, MD  ?   ? ?Allergies    ?Patient has no known allergies.   ? ?Review of Systems   ?Review of Systems  ?Constitutional:  Negative for chills and fever.  ?Eyes:  Positive for visual disturbance.  ?Respiratory:  Negative for shortness of breath.   ?Cardiovascular:  Negative for chest pain.  ?Gastrointestinal:  Negative for abdominal pain, diarrhea, nausea and vomiting.  ?Neurological:  Positive for dizziness, light-headedness and headaches.  ? ?Physical Exam ?Updated Vital Signs ?BP 120/66   Pulse 66   Temp 97.9 ?F (36.6 ?C) (Oral)   Resp 14   Ht '5\' 9"'$  (1.753 m)   Wt 72.6 kg   SpO2 97%   BMI 23.63 kg/m?  ?Physical Exam ?Vitals and nursing note reviewed.  ?Constitutional:   ?   General: He is not in acute distress. ?   Appearance: He is not ill-appearing.  ?HENT:  ?   Head: Normocephalic and atraumatic.  ?   Nose: No congestion.  ?Eyes:  ?   Conjunctiva/sclera: Conjunctivae normal.  ?Cardiovascular:  ?   Rate and Rhythm: Normal rate and regular rhythm.  ?   Pulses: Normal pulses.  ?   Heart sounds: No murmur heard. ?  No friction  rub. No gallop.  ?Pulmonary:  ?   Effort: No respiratory distress.  ?   Breath sounds: No wheezing, rhonchi or rales.  ?Abdominal:  ?   Palpations: Abdomen is soft.  ?   Tenderness: There is no abdominal tenderness. There is no right CVA tenderness or left CVA tenderness.  ?Musculoskeletal:  ?   Cervical back: No tenderness.  ?   Right lower leg: No edema.  ?   Left lower leg: No edema.  ?Skin: ?   General: Skin is warm and dry.  ?Neurological:  ?   Mental Status: He is alert.  ?   GCS: GCS eye subscore is 4. GCS verbal subscore is 5. GCS motor subscore is 6.  ?   Cranial Nerves: Cranial nerves 2-12 are intact. No  cranial nerve deficit or facial asymmetry.  ?   Sensory: Sensation is intact.  ?   Motor: Motor function is intact. No weakness.  ?   Coordination: Romberg sign negative. Finger-Nose-Finger Test and Heel to [place] Test normal.  ?   Gait: Gait normal.  ?   Comments: Patient is alert and orient x4, able to identify common objects like a shoe and a pen, cranial nerves II through XII grossly intact no difficulty with word finding, following two-step commands, no unilateral weakness present.  ?Psychiatric:     ?   Mood and Affect: Mood normal.  ? ? ?ED Results / Procedures / Treatments   ?Labs ?(all labs ordered are listed, but only abnormal results are displayed) ?Labs Reviewed  ?BASIC METABOLIC PANEL - Abnormal; Notable for the following components:  ?    Result Value  ? Glucose, Bld 114 (*)   ? Creatinine, Ser 1.25 (*)   ? All other components within normal limits  ?CBC WITH DIFFERENTIAL/PLATELET  ?PROTIME-INR  ?I-STAT CHEM 8, ED  ? ? ?EKG ?EKG Interpretation ? ?Date/Time:  Wednesday Jun 19 2021 09:06:56 EDT ?Ventricular Rate:  74 ?PR Interval:  166 ?QRS Duration: 93 ?QT Interval:  382 ?QTC Calculation: 424 ?R Axis:   78 ?Text Interpretation: Sinus rhythm Abnormal R-wave progression, early transition No significant change since prior 10/17 Confirmed by Aletta Edouard [id number]) on 06/19/2021 10:34:05 AM ? ?Radiology ?CT ANGIO HEAD NECK W WO CM ? ?Result Date: 06/19/2021 ?CLINICAL DATA:  Severe right-sided headache, confusion, speech difficulty. Rule out subarachnoid hemorrhage. History prostate cancer EXAM: CT ANGIOGRAPHY HEAD AND NECK TECHNIQUE: Multidetector CT imaging of the head and neck was performed using the standard protocol during bolus administration of intravenous contrast. Multiplanar CT image reconstructions and MIPs were obtained to evaluate the vascular anatomy. Carotid stenosis measurements (when applicable) are obtained utilizing NASCET criteria, using the distal internal carotid diameter as the  denominator. RADIATION DOSE REDUCTION: This exam was performed according to the departmental dose-optimization program which includes automated exposure control, adjustment of the mA and/or kV according to patient size and/or use of iterative reconstruction technique. CONTRAST:  56m OMNIPAQUE IOHEXOL 350 MG/ML SOLN COMPARISON:  None Available. FINDINGS: CT HEAD FINDINGS Brain: No evidence of acute infarction, hemorrhage, hydrocephalus, extra-axial collection or mass lesion/mass effect. Mild patchy white matter hypodensity bilaterally. Small hypodensities in the basal ganglia bilaterally most likely due to chronic microvascular ischemia. Vascular: Negative for hyperdense vessel Skull: Negative Sinuses: Paranasal sinuses clear. Orbits: Negative Review of the MIP images confirms the above findings CTA NECK FINDINGS Aortic arch: Normal aortic arch. Proximal great vessels widely patent Right carotid system: Normal right carotid system. Negative for atherosclerotic disease or dissection. Left  carotid system: Normal left carotid system. Negative for atherosclerotic disease or dissection. Vertebral arteries: Normal vertebral arteries bilaterally which are widely patent to the skull base. Skeleton: Mild degenerative change cervical spine without acute skeletal abnormality. Other neck: Negative for mass or edema in the neck. Upper chest: Lung apices clear bilaterally. Review of the MIP images confirms the above findings CTA HEAD FINDINGS Anterior circulation: Internal carotid artery widely patent through the skull base and cavernous segment. Hypoplastic right A1 segment. Both anterior cerebral arteries are patent supplied from the left. Right M1 segment widely patent. Right middle cerebral artery branches patent without significant stenosis Mild stenosis left M1 segment. Left middle cerebral artery branches are patent without stenosis. Posterior circulation: Both vertebral arteries are patent to the basilar without  significant stenosis. PICA patent bilaterally. Basilar widely patent. Basilar ends in the superior cerebellar arteries bilaterally. Fetal origin of the posterior cerebral artery bilaterally. Mild stenosis right P2 segment and

## 2021-09-20 ENCOUNTER — Other Ambulatory Visit: Payer: Self-pay | Admitting: Family Medicine

## 2021-11-04 ENCOUNTER — Other Ambulatory Visit: Payer: Self-pay | Admitting: Orthopedic Surgery

## 2021-11-04 DIAGNOSIS — R937 Abnormal findings on diagnostic imaging of other parts of musculoskeletal system: Secondary | ICD-10-CM

## 2021-12-06 ENCOUNTER — Encounter: Payer: Self-pay | Admitting: Nurse Practitioner

## 2021-12-06 ENCOUNTER — Ambulatory Visit: Payer: 59 | Admitting: Nurse Practitioner

## 2021-12-06 VITALS — BP 125/73 | HR 69 | Temp 98.4°F | Ht 69.0 in | Wt 170.0 lb

## 2021-12-06 DIAGNOSIS — R3989 Other symptoms and signs involving the genitourinary system: Secondary | ICD-10-CM | POA: Diagnosis not present

## 2021-12-06 DIAGNOSIS — R350 Frequency of micturition: Secondary | ICD-10-CM

## 2021-12-06 DIAGNOSIS — R3915 Urgency of urination: Secondary | ICD-10-CM | POA: Diagnosis not present

## 2021-12-06 LAB — POCT URINALYSIS DIP (CLINITEK)
Bilirubin, UA: NEGATIVE
Blood, UA: NEGATIVE
Glucose, UA: NEGATIVE mg/dL
Ketones, POC UA: NEGATIVE mg/dL
Leukocytes, UA: NEGATIVE
Nitrite, UA: NEGATIVE
POC PROTEIN,UA: NEGATIVE
Spec Grav, UA: 1.01 (ref 1.010–1.025)
Urobilinogen, UA: 0.2 E.U./dL
pH, UA: 6 (ref 5.0–8.0)

## 2021-12-06 MED ORDER — CEPHALEXIN 500 MG PO CAPS: 500.0000 mg | ORAL_CAPSULE | Freq: Three times a day (TID) | ORAL | 0 refills | Status: DC

## 2021-12-06 NOTE — Progress Notes (Unsigned)
   Subjective:    Patient ID: Kenneth Fritz, male    DOB: Nov 13, 1955, 66 y.o.   MRN: [id number]  HPI Presents for complaints of urinary symptoms for the past 3 to 4 days.  No dysuria.  Some urgency and frequency at times.  No hesitancy.  No change in his stream.  Note patient has had a total prostatectomy due to prostate cancer.  Has not had any problems since his surgery.  Describes his urine as dark yellow, slightly "orange" in color.  No OTC supplements or foods to explain any change in color.  Taking fluids well.  Mild right low back pain.  No flank pain.  Slight mid lower abdominal pain at times.  No fever or chills.  No history of UTI.  Denies any risk of STI.   Review of Systems  Constitutional:  Negative for chills and fatigue.  Respiratory:  Negative for chest tightness and shortness of breath.   Cardiovascular:  Negative for chest pain.  Gastrointestinal:  Positive for abdominal pain. Negative for constipation, diarrhea, nausea and vomiting.  Genitourinary:  Positive for frequency. Negative for difficulty urinating, dysuria, enuresis, flank pain and urgency.       Objective:   Physical Exam NAD.  Alert, oriented.  Lungs clear.  Heart regular rate rhythm.  No CVA tenderness to percussion.  Abdomen soft nondistended nontender.  Gets on and off exam table without difficulty. Results for orders placed or performed in visit on 12/06/21  POCT URINALYSIS DIP (CLINITEK)  Result Value Ref Range   Color, UA yellow yellow   Clarity, UA clear clear   Glucose, UA negative negative mg/dL   Bilirubin, UA negative negative   Ketones, POC UA negative negative mg/dL   Spec Grav, UA 1.010 1.010 - 1.025   Blood, UA negative negative   pH, UA 6.0 5.0 - 8.0   POC PROTEIN,UA negative negative, trace   Urobilinogen, UA 0.2 0.2 or 1.0 E.U./dL   Nitrite, UA Negative Negative   Leukocytes, UA Negative Negative   Today's Vitals   12/06/21 1309  BP: 125/73  Pulse: 69  Temp: 98.4 F (36.9 C)   SpO2: 99%  Weight: 170 lb (77.1 kg)  Height: '5\' 9"'$  (1.753 m)   Body mass index is 25.1 kg/m.        Assessment & Plan:   Problem List Items Addressed This Visit   None Visit Diagnoses     Dark yellow-colored urine    -  Primary   Relevant Orders   POCT URINALYSIS DIP (CLINITEK) (Completed)   Urine Culture   Urinary urgency       Relevant Orders   Urine Culture   Urinary frequency       Relevant Orders   Urine Culture      Meds ordered this encounter  Medications   cephALEXin (KEFLEX) 500 MG capsule    Sig: Take 1 capsule (500 mg total) by mouth 3 (three) times daily.    Dispense:  15 capsule    Refill:  0    Order Specific Question:   Supervising Provider    Answer:   Kathyrn Drown [[id number]]   Start on Keflex for the weekend.  Urine culture pending.  Further follow-up based on results.  Warning signs reviewed with patient including fever, worsening urinary symptoms and CVA/flank tenderness.  Patient to go to ED or urgent care over the weekend if worse.  Recheck if symptoms persist.

## 2021-12-07 ENCOUNTER — Encounter: Payer: Self-pay | Admitting: Nurse Practitioner

## 2021-12-11 ENCOUNTER — Other Ambulatory Visit: Payer: Self-pay | Admitting: Nurse Practitioner

## 2021-12-11 ENCOUNTER — Encounter: Payer: Self-pay | Admitting: Nurse Practitioner

## 2021-12-11 LAB — URINE CULTURE

## 2021-12-13 ENCOUNTER — Other Ambulatory Visit: Payer: Self-pay | Admitting: Family Medicine

## 2021-12-13 ENCOUNTER — Other Ambulatory Visit: Payer: Self-pay | Admitting: Nurse Practitioner

## 2021-12-13 MED ORDER — CEPHALEXIN 500 MG PO CAPS
500.0000 mg | ORAL_CAPSULE | Freq: Three times a day (TID) | ORAL | 0 refills | Status: DC
Start: 2021-12-13 — End: 2022-01-01

## 2021-12-17 ENCOUNTER — Encounter: Payer: Self-pay | Admitting: Family Medicine

## 2021-12-17 NOTE — Telephone Encounter (Signed)
For his overall health and safety it is recommended to do a regular visit  May have a refill on his B12 medicine but nonetheless it is important for him to do a follow-up preferably between now and the end of the year I would recommend that he schedule the appointment and request labs at that time thank you

## 2021-12-18 ENCOUNTER — Telehealth: Payer: Self-pay | Admitting: Family Medicine

## 2021-12-18 ENCOUNTER — Other Ambulatory Visit: Payer: Self-pay

## 2021-12-18 DIAGNOSIS — E7849 Other hyperlipidemia: Secondary | ICD-10-CM

## 2021-12-18 DIAGNOSIS — E538 Deficiency of other specified B group vitamins: Secondary | ICD-10-CM

## 2021-12-18 DIAGNOSIS — Z8546 Personal history of malignant neoplasm of prostate: Secondary | ICD-10-CM

## 2021-12-18 DIAGNOSIS — R739 Hyperglycemia, unspecified: Secondary | ICD-10-CM

## 2021-12-18 DIAGNOSIS — Z125 Encounter for screening for malignant neoplasm of prostate: Secondary | ICD-10-CM

## 2021-12-18 MED ORDER — CYANOCOBALAMIN 1000 MCG/ML IJ SOLN: 1000.0000 ug | INTRAMUSCULAR | 0 refills | Status: DC

## 2021-12-18 NOTE — Telephone Encounter (Signed)
Metabolic 7, lipid, liver, PSA, A1c, B12 Screening prostate cancer, hyperlipidemia, hyperglycemia, B12 deficiency

## 2021-12-18 NOTE — Telephone Encounter (Signed)
Patient has appointment for follow up to continue getting B 12 shots and needing labs done.

## 2021-12-19 NOTE — Telephone Encounter (Signed)
Pt replied via my chart and verbalized understanding.  

## 2021-12-19 NOTE — Telephone Encounter (Signed)
Lab orders placed. Left message to return call. Sent my chart message

## 2021-12-24 LAB — LIPID PANEL
Chol/HDL Ratio: 5 ratio (ref 0.0–5.0)
Cholesterol, Total: 180 mg/dL (ref 100–199)
HDL: 36 mg/dL — ABNORMAL LOW (ref >39)
LDL Chol Calc (NIH): 128 mg/dL — ABNORMAL HIGH (ref 0–99)
Triglycerides: 84 mg/dL (ref 0–149)
VLDL Cholesterol Cal: 16 mg/dL (ref 5–40)

## 2021-12-24 LAB — BASIC METABOLIC PANEL
BUN/Creatinine Ratio: 15 (ref 10–24)
BUN: 16 mg/dL (ref 8–27)
CO2: 24 mmol/L (ref 20–29)
Calcium: 9.5 mg/dL (ref 8.6–10.2)
Chloride: 105 mmol/L (ref 96–106)
Creatinine, Ser: 1.08 mg/dL (ref 0.76–1.27)
Glucose: 98 mg/dL (ref 70–99)
Potassium: 4.5 mmol/L (ref 3.5–5.2)
Sodium: 142 mmol/L (ref 134–144)
eGFR: 76 mL/min/{1.73_m2} (ref >59)

## 2021-12-24 LAB — VITAMIN B12: Vitamin B-12: 568 pg/mL (ref 232–1245)

## 2021-12-24 LAB — HEMOGLOBIN A1C
Est. average glucose Bld gHb Est-mCnc: 108 mg/dL
Hgb A1c MFr Bld: 5.4 % (ref 4.8–5.6)

## 2021-12-24 LAB — PSA: Prostate Specific Ag, Serum: <0.1 ng/mL (ref 0.0–4.0)

## 2022-01-01 ENCOUNTER — Ambulatory Visit: Payer: 59 | Admitting: Family Medicine

## 2022-01-01 VITALS — BP 134/76 | HR 98 | Ht 69.0 in | Wt 164.2 lb

## 2022-01-01 DIAGNOSIS — R300 Dysuria: Secondary | ICD-10-CM

## 2022-01-01 DIAGNOSIS — E7849 Other hyperlipidemia: Secondary | ICD-10-CM | POA: Diagnosis not present

## 2022-01-01 DIAGNOSIS — R350 Frequency of micturition: Secondary | ICD-10-CM | POA: Diagnosis not present

## 2022-01-01 LAB — POCT URINALYSIS DIPSTICK
Spec Grav, UA: 1.01 (ref 1.010–1.025)
pH, UA: 7 (ref 5.0–8.0)

## 2022-01-01 MED ORDER — CYANOCOBALAMIN 1000 MCG/ML IJ SOLN: INTRAMUSCULAR | 1 refills | Status: DC

## 2022-01-01 NOTE — Progress Notes (Signed)
   Subjective:    Patient ID: Kenneth Fritz, male    DOB: [birth year], 66 y.o.   MRN: [id number]  HPI  Patient arrives for a follow up on Vit B12 injections. Patient states he currently gets the injections from his wife and had one on the 17th or 18th.   Patient states he was seen recently and treated for UTI and finished all the antibiotic but the symptoms have returned  Review of Systems     Objective:   Physical Exam The 10-year ASCVD risk score (Arnett DK, et al., 2019) is: 18.8%   Values used to calculate the score:     Age: 52 years     Sex: Male     Is Non-Hispanic African American: No     Diabetic: No     Tobacco smoker: No     Systolic Blood Pressure: 156 mmHg     Is BP treated: No     HDL Cholesterol: 36 mg/dL     Total Cholesterol: 180 mg/dL  General-in no acute distress Eyes-no discharge Lungs-respiratory rate normal, CTA CV-no murmurs,RRR Extremities skin warm dry no edema Neuro grossly normal Behavior normal, alert       Assessment & Plan:   1. Dysuria Culture sent hold off on antibiotics - POCT urinalysis dipstick - Urine Culture  2. Urinary frequency Culture sent - Urine Culture  3. Other hyperlipidemia Elevated LDL at risk for heart disease patient agrees to coronary calcium does not want to be on statin if he does not have to be - CT CARDIAC SCORING (SELF PAY ONLY)  B12 deficiency continue monthly shots at home

## 2022-01-04 LAB — URINE CULTURE

## 2022-01-06 ENCOUNTER — Encounter: Payer: Self-pay | Admitting: Family Medicine

## 2022-01-06 DIAGNOSIS — R350 Frequency of micturition: Secondary | ICD-10-CM

## 2022-01-06 NOTE — Telephone Encounter (Signed)
Nurses-please go ahead with urgent referral to Dr. Alinda Money with [facility] thank you-Dr. Nicki Reaper  Please let Grayland Ormond know the status of this as well

## 2022-01-10 ENCOUNTER — Encounter: Payer: Self-pay | Admitting: Family Medicine

## 2022-01-10 ENCOUNTER — Other Ambulatory Visit: Payer: Self-pay

## 2022-01-10 MED ORDER — CYANOCOBALAMIN 1000 MCG/ML IJ SOLN: INTRAMUSCULAR | 1 refills | Status: AC

## 2022-01-21 ENCOUNTER — Encounter: Payer: Self-pay | Admitting: Emergency Medicine

## 2022-01-21 ENCOUNTER — Other Ambulatory Visit: Payer: Self-pay

## 2022-01-21 ENCOUNTER — Ambulatory Visit
Admission: EM | Admit: 2022-01-21 | Discharge: 2022-01-21 | Disposition: A | Discharge status: Home / Self Care | Payer: Medicare Other | Attending: Family Medicine | Admitting: Family Medicine

## 2022-01-21 DIAGNOSIS — Z1152 Encounter for screening for COVID-19: Secondary | ICD-10-CM | POA: Diagnosis not present

## 2022-01-21 DIAGNOSIS — J029 Acute pharyngitis, unspecified: Secondary | ICD-10-CM | POA: Diagnosis not present

## 2022-01-21 DIAGNOSIS — R197 Diarrhea, unspecified: Secondary | ICD-10-CM

## 2022-01-21 DIAGNOSIS — J069 Acute upper respiratory infection, unspecified: Secondary | ICD-10-CM | POA: Diagnosis not present

## 2022-01-21 DIAGNOSIS — R059 Cough, unspecified: Secondary | ICD-10-CM | POA: Diagnosis not present

## 2022-01-21 MED ORDER — OSELTAMIVIR PHOSPHATE 75 MG PO CAPS: 75.0000 mg | ORAL_CAPSULE | Freq: Two times a day (BID) | ORAL | 0 refills | Status: DC

## 2022-01-21 MED ORDER — PROMETHAZINE-DM 6.25-15 MG/5ML PO SYRP: 5.0000 mL | ORAL_SOLUTION | Freq: Four times a day (QID) | ORAL | 0 refills | Status: DC | PRN

## 2022-01-21 NOTE — ED Provider Notes (Signed)
[facility]    CSN: [id number] Arrival date & time: 01/21/22  1749      History   Chief Complaint Chief Complaint  Patient presents with   Cough    HPI Kenneth Fritz is a 65 y.o. male.   Presenting today with about a week of fever, chills, body aches, cough, diarrhea, congestion, sore throat.  Denies chest pain, shortness of breath, abdominal pain, vomiting.  Taking over-the-counter cough syrups and Mucinex with minimal relief.  Home COVID test was negative.  No known sick contacts recently.  No known history of chronic pulmonary disease.    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    Past Surgical History:  Procedure Laterality Date   CHOLECYSTECTOMY     COLONOSCOPY N/A 12/07/2019   Procedure: COLONOSCOPY;  Surgeon: Daneil Dolin, MD;  [facility]: [facility];  Service: Endoscopy;  Laterality: N/A;  1:15pm   KNEE ARTHROSCOPY Left    POLYPECTOMY  12/07/2019   Procedure: POLYPECTOMY;  Surgeon: Daneil Dolin, MD;  Location: [facility];  Service: Endoscopy;;   SHOULDER ARTHROSCOPY WITH SUBACROMIAL DECOMPRESSION Left 11/29/2015   Procedure: SHOULDER ARTHROSCOPY WITH SUBACROMIAL DECOMPRESSION;  Surgeon: Carole Civil, MD;  Location: [facility];  Service: Orthopedics;  Laterality: Left;  AND DEBRIDEMENT Pt notified new arrival time 9:45am per KF 10/16   XI ROBOTIC [place] Medications    Prior to Admission medications    Medication Sig Start Date End Date Taking? Authorizing Provider  oseltamivir (TAMIFLU) 75 MG capsule Take 1 capsule (75 mg total) by mouth every 12 (twelve) hours. 01/21/22  Yes Volney American, PA-C  promethazine-dextromethorphan (PROMETHAZINE-DM) 6.25-15 MG/5ML syrup Take 5 mLs by mouth 4 (four) times daily as needed. 01/21/22  Yes Volney American, PA-C  cyanocobalamin (VITAMIN B12) 1000 MCG/ML injection 1 ml IM per month 01/10/22   Kathyrn Drown, MD    Family History Family History  Problem Relation Age of Onset   Diabetes Mother    Healthy Father    Colon cancer Neg Hx    Gastric cancer Neg Hx    Esophageal cancer Neg Hx     Social History Social History   Tobacco Use   Smoking status: Never   Smokeless tobacco: Never  Vaping Use   Vaping Use: Never used  Substance Use Topics   Alcohol use: No   Drug use: No     Allergies   Patient has no known allergies.   Review of Systems Review of Systems Per HPI  Physical Exam Triage Vital Signs ED Triage Vitals [01/21/22 1927]  Enc Vitals Group     BP (!) 145/91     Pulse Rate 79     Resp 20     Temp 98.7 F (37.1 C)     Temp Source Oral     SpO2 96 %  Weight      Height      Head Circumference      Peak Flow      Pain Score 6     Pain Loc      Pain Edu?      Excl. in [place]?    No data found.  Updated Vital Signs BP (!) 145/91 (BP Location: Right Arm)   Pulse 79   Temp 98.7 F (37.1 C) (Oral)   Resp 20   SpO2 96%   Visual Acuity Right Eye Distance:   Left Eye Distance:   Bilateral Distance:    Right Eye Near:   Left Eye Near:    Bilateral Near:     Physical Exam Vitals and nursing note reviewed.  Constitutional:      Appearance: He is well-developed.  HENT:     Head: Atraumatic.     Right Ear: External ear normal.     Left Ear: External ear normal.     Nose: Rhinorrhea present.     Mouth/Throat:     Pharynx: Posterior oropharyngeal erythema present. No oropharyngeal  exudate.  Eyes:     Conjunctiva/sclera: Conjunctivae normal.     Pupils: Pupils are equal, round, and reactive to light.  Cardiovascular:     Rate and Rhythm: Normal rate and regular rhythm.  Pulmonary:     Effort: Pulmonary effort is normal. No respiratory distress.     Breath sounds: No wheezing or rales.  Musculoskeletal:        General: Normal range of motion.     Cervical back: Normal range of motion and neck supple.  Lymphadenopathy:     Cervical: No cervical adenopathy.  Skin:    General: Skin is warm and dry.  Neurological:     Mental Status: He is alert and oriented to person, place, and time.  Psychiatric:        Behavior: Behavior normal.      UC Treatments / Results  Labs (all labs ordered are listed, but only abnormal results are displayed) Labs Reviewed  RESP PANEL BY RT-PCR (FLU A&B, COVID) [id number]    EKG   Radiology No results found.  Procedures Procedures (including critical care time)  Medications Ordered in UC Medications - No data to display  Initial Impression / Assessment and Plan / UC Course  I have reviewed the triage vital signs and the nursing notes.  Pertinent labs & imaging results that were available during my care of the patient were reviewed by me and considered in my medical decision making (see chart for details).     Vital signs and exam overall reassuring today, suggestive of a viral upper respiratory infection likely influenza.  Will start Tamiflu given lack of improvement after 1 week, Phenergan DM, Imodium as needed.  Return for worsening symptoms.  Final Clinical Impressions(s) / UC Diagnoses   Final diagnoses:  Viral URI with cough  Diarrhea, unspecified type   Discharge Instructions   None    ED Prescriptions     Medication Sig Dispense Auth. Provider   promethazine-dextromethorphan (PROMETHAZINE-DM) 6.25-15 MG/5ML syrup Take 5 mLs by mouth 4 (four) times daily as needed. 100 mL Volney American, [place]    oseltamivir (TAMIFLU) 75 MG capsule Take 1 capsule (75 mg total) by mouth every 12 (twelve) hours. 10 capsule Volney American, [place]      PDMP not reviewed this encounter.   Volney American, [place] 01/21/22 1947

## 2022-01-21 NOTE — ED Triage Notes (Signed)
Pt reports cough, chills, diarrhea x1 week. Home covid test negative.

## 2022-01-22 ENCOUNTER — Ambulatory Visit: Payer: 59

## 2022-01-22 LAB — RESP PANEL BY RT-PCR (FLU A&B, COVID) ARPGX2
Influenza A by PCR: NEGATIVE
Influenza B by PCR: NEGATIVE
SARS Coronavirus 2 by RT PCR: NEGATIVE

## 2022-01-23 DIAGNOSIS — N3000 Acute cystitis without hematuria: Secondary | ICD-10-CM | POA: Diagnosis not present

## 2022-02-11 ENCOUNTER — Ambulatory Visit (HOSPITAL_COMMUNITY)
Admission: RE | Admit: 2022-02-11 | Discharge: 2022-02-11 | Disposition: A | Discharge status: Home / Self Care | Payer: 59 | Source: Ambulatory Visit | Attending: Family Medicine | Admitting: Family Medicine

## 2022-02-11 DIAGNOSIS — E7849 Other hyperlipidemia: Secondary | ICD-10-CM | POA: Insufficient documentation

## 2022-02-19 ENCOUNTER — Ambulatory Visit (INDEPENDENT_AMBULATORY_CARE_PROVIDER_SITE_OTHER): Payer: Medicare Other | Admitting: Family Medicine

## 2022-02-19 ENCOUNTER — Encounter: Payer: Self-pay | Admitting: Family Medicine

## 2022-02-19 VITALS — BP 127/81 | Wt 163.8 lb

## 2022-02-19 DIAGNOSIS — R911 Solitary pulmonary nodule: Secondary | ICD-10-CM | POA: Diagnosis not present

## 2022-02-19 DIAGNOSIS — R931 Abnormal findings on diagnostic imaging of heart and coronary circulation: Secondary | ICD-10-CM | POA: Diagnosis not present

## 2022-02-19 DIAGNOSIS — E7849 Other hyperlipidemia: Secondary | ICD-10-CM

## 2022-02-19 MED ORDER — ROSUVASTATIN CALCIUM 10 MG PO TABS: 10.0000 mg | ORAL_TABLET | Freq: Every day | ORAL | 1 refills | Status: DC

## 2022-02-19 NOTE — Addendum Note (Signed)
Addended by: Vicente Males on: 02/19/2022 02:10 PM   Modules accepted: Orders

## 2022-02-19 NOTE — Progress Notes (Signed)
   Subjective:    Patient ID: Kenneth Fritz, male    DOB: 10-Sep-1955, 67 y.o.   MRN: [id number]  HPI Pt arrives to discuss recent CT scan. Pt had CT cardiac scoring on 02/11/22.  Detailed discussion held with patient discussing elevated coronary calcium pulmonary nodules on scan and small liver spot. He denies any type of chest tightness pressure pain shortness of breath Has hyperlipidemia States he could do better on diet Will start walking for exercise   Review of Systems     Objective:   Physical Exam Lungs clear heart regular Time spent today discussing the results of this scan       Assessment & Plan:  Pulmonary nodule will go ahead and set up dedicated CT scan of the chest to look at his pulmonary nodule then based on that he is a low risk candidate may or may not need follow-up imaging  Elevated coronary calcium go ahead and get LDL below 70 risk and benefits of Crestor discussed patient agrees to medication 10 mg daily Repeat lipid liver in 8 to 10 weeks  Patient is moving to [place] he will work hard at getting a doctor down that way Send [place] any concerns questions or problems

## 2022-02-19 NOTE — Progress Notes (Signed)
02/19/22-CT scan ordered and referral coordinator made aware

## 2022-03-13 ENCOUNTER — Ambulatory Visit (HOSPITAL_COMMUNITY)
Admission: RE | Admit: 2022-03-13 | Discharge: 2022-03-13 | Disposition: A | Discharge status: Home / Self Care | Payer: Medicare Other | Source: Ambulatory Visit | Attending: Family Medicine | Admitting: Family Medicine

## 2022-03-13 DIAGNOSIS — R918 Other nonspecific abnormal finding of lung field: Secondary | ICD-10-CM | POA: Diagnosis not present

## 2022-03-13 DIAGNOSIS — R911 Solitary pulmonary nodule: Secondary | ICD-10-CM | POA: Insufficient documentation

## 2022-03-18 ENCOUNTER — Encounter: Payer: Self-pay | Admitting: Family Medicine

## 2022-03-19 ENCOUNTER — Telehealth: Payer: Self-pay | Admitting: Family Medicine

## 2022-03-19 DIAGNOSIS — R911 Solitary pulmonary nodule: Secondary | ICD-10-CM

## 2022-03-19 DIAGNOSIS — R9389 Abnormal findings on diagnostic imaging of other specified body structures: Secondary | ICD-10-CM

## 2022-03-19 NOTE — Telephone Encounter (Signed)
Nurses Patient has abnormal CAT scan with a pulmonary nodule I did discuss the case with pulmonologist who handles these cases They recommend PET scan Radiologist also recommends PET scan Therefore go ahead with scheduling PET scan ASAP.  The PET scan can be anywhere within the [facility] system that can get him in the quickest Very important this gets done quickly thank you

## 2022-03-19 NOTE — Telephone Encounter (Signed)
STAT Pet scan ordered. Message sent to referral coordinator.

## 2022-03-20 NOTE — Telephone Encounter (Signed)
Contacted patient to make him aware. Pt verbalized understanding

## 2022-03-20 NOTE — Telephone Encounter (Signed)
Awesome job to the nurses and Loma Sousa thank you

## 2022-03-20 NOTE — Telephone Encounter (Signed)
Pt is scheduled for 03-26-22

## 2022-03-20 NOTE — Telephone Encounter (Signed)
Contacted pt to make him aware that we are ordering a PET scan. Pt verbalized understanding

## 2022-03-26 ENCOUNTER — Encounter (HOSPITAL_COMMUNITY): Payer: Medicare Other

## 2022-03-26 NOTE — Telephone Encounter (Signed)
Unfortunately it does not surprise me that insurance companies delay covering procedures that tend to be high in cost.  Nurse says please have Kenneth Fritz look into what is going on here to see if there is anything that we need to be doing on his behalf-very important that this PET scan be completed-unfortunately got put off till March 1 but we cannot assume that it is going to get it removed.  Please look into this and keep me posted thank you (In some situations insurance companies will deny doing test unless it comes through the specialist.  We were trying to speed up the process by ordering the PET scan because waiting for a specialist appointment could delay this whole process.) Lease let me know which you find out and also keep Kenneth Fritz informed

## 2022-04-10 ENCOUNTER — Encounter: Payer: Self-pay | Admitting: Radiology

## 2022-04-11 ENCOUNTER — Encounter (HOSPITAL_COMMUNITY)
Admission: RE | Admit: 2022-04-11 | Discharge: 2022-04-11 | Disposition: A | Discharge status: Home / Self Care | Payer: Medicare Other | Source: Ambulatory Visit | Attending: Family Medicine | Admitting: Family Medicine

## 2022-04-11 DIAGNOSIS — R911 Solitary pulmonary nodule: Secondary | ICD-10-CM | POA: Insufficient documentation

## 2022-04-11 DIAGNOSIS — R9389 Abnormal findings on diagnostic imaging of other specified body structures: Secondary | ICD-10-CM | POA: Insufficient documentation

## 2022-04-11 DIAGNOSIS — C61 Malignant neoplasm of prostate: Secondary | ICD-10-CM | POA: Diagnosis present

## 2022-04-11 LAB — GLUCOSE, CAPILLARY: Glucose-Capillary: 120 mg/dL — ABNORMAL HIGH (ref 70–99)

## 2022-04-11 MED ORDER — FLUDEOXYGLUCOSE F - 18 (FDG) INJECTION
8.1000 | Freq: Once | INTRAVENOUS | Status: AC
  Administered 2022-04-11: 8.09 via INTRAVENOUS

## 2022-04-15 ENCOUNTER — Encounter: Payer: Self-pay | Admitting: Family Medicine

## 2022-04-17 ENCOUNTER — Encounter: Payer: Self-pay | Admitting: Family Medicine

## 2022-05-25 ENCOUNTER — Encounter: Payer: Self-pay | Admitting: Family Medicine

## 2022-05-27 MED ORDER — ROSUVASTATIN CALCIUM 10 MG PO TABS: 10.0000 mg | ORAL_TABLET | Freq: Every day | ORAL | 1 refills | Status: DC

## 2022-06-03 DIAGNOSIS — R931 Abnormal findings on diagnostic imaging of heart and coronary circulation: Secondary | ICD-10-CM | POA: Diagnosis not present

## 2022-06-03 DIAGNOSIS — E7849 Other hyperlipidemia: Secondary | ICD-10-CM | POA: Diagnosis not present

## 2022-06-04 ENCOUNTER — Telehealth: Payer: Self-pay | Admitting: Family Medicine

## 2022-06-04 ENCOUNTER — Encounter: Payer: Self-pay | Admitting: Family Medicine

## 2022-06-04 ENCOUNTER — Other Ambulatory Visit: Payer: Self-pay | Admitting: Family Medicine

## 2022-06-04 LAB — HEPATIC FUNCTION PANEL
ALT: 15 IU/L (ref 0–44)
AST: 13 IU/L (ref 0–40)
Albumin: 4.1 g/dL (ref 3.9–4.9)
Alkaline Phosphatase: 100 IU/L (ref 44–121)
Bilirubin Total: 0.8 mg/dL (ref 0.0–1.2)
Bilirubin, Direct: 0.21 mg/dL (ref 0.00–0.40)
Total Protein: 6.4 g/dL (ref 6.0–8.5)

## 2022-06-04 LAB — LIPID PANEL
Chol/HDL Ratio: 3.5 ratio (ref 0.0–5.0)
Cholesterol, Total: 135 mg/dL (ref 100–199)
HDL: 39 mg/dL — ABNORMAL LOW
LDL Chol Calc (NIH): 78 mg/dL (ref 0–99)
Triglycerides: 92 mg/dL (ref 0–149)
VLDL Cholesterol Cal: 18 mg/dL (ref 5–40)

## 2022-06-04 MED ORDER — ROSUVASTATIN CALCIUM 20 MG PO TABS: 20.0000 mg | ORAL_TABLET | Freq: Every day | ORAL | 1 refills | Status: AC

## 2022-06-04 NOTE — Telephone Encounter (Signed)
Nurses I did send in his medicine as requested on his MyChart  Please order lipid liver to be done through [facility] he is aware to get it done in 10 to 12 weeks  Also please make sure we schedule CT scan of his chest without contrast for pulmonary nodule to be done at the end of May start of June as a 12-month follow-up to previous PET scan as recommended by the radiologist  The patient is aware this is going on it would be wise to get it scheduled  It is also possible he may be moving to [place] and if so he will cancel all this and get this done through a new provider down in [place]  Please keep me in the loop with any problems thank you

## 2022-06-05 NOTE — Telephone Encounter (Signed)
See My chart message- patient stated he is moving to [place] in [facility]  May

## 2022-06-06 NOTE — Telephone Encounter (Signed)
Will be putting together a letter along with pertinent medical records for the patient to pick up-please see recent MyChart

## 2022-06-10 ENCOUNTER — Encounter: Payer: Self-pay | Admitting: Family Medicine

## 2022-06-10 NOTE — Progress Notes (Signed)
Front Please print this letter to go with copies of the PET scan and CT scan cardiac score test and my most recent office visit note that I printed off  Please also print off last 6 months of lab work to go with these papers  Then please notify can need to come by and pick these up to take with him because he is moving to [place]  Thank you-Dr. Lorin Picket

## 2022-06-10 NOTE — Telephone Encounter (Signed)
Please see letter that was dictated I also printed off additional information it is an outgoing chair in my office to go with this letter along with what is stated in the message with the letter thank you Please provide these forward to Bernette Redbird If any questions let me know

## 2022-07-02 DIAGNOSIS — E538 Deficiency of other specified B group vitamins: Secondary | ICD-10-CM | POA: Diagnosis not present

## 2022-07-02 DIAGNOSIS — Z1159 Encounter for screening for other viral diseases: Secondary | ICD-10-CM | POA: Diagnosis not present

## 2022-07-02 DIAGNOSIS — E785 Hyperlipidemia, unspecified: Secondary | ICD-10-CM | POA: Diagnosis not present

## 2022-07-02 DIAGNOSIS — R911 Solitary pulmonary nodule: Secondary | ICD-10-CM | POA: Diagnosis not present

## 2022-10-22 ENCOUNTER — Encounter: Payer: Self-pay | Admitting: *Deleted

## 2023-01-13 IMAGING — CT CT ANGIO HEAD-NECK (W OR W/O PERF)
2 of 12 series · 6 of 33 positions shown · non-contrast
Comparison: None Available.

CLINICAL DATA: Severe right-sided headache, confusion, speech
difficulty. Rule out subarachnoid hemorrhage. History prostate
cancer

EXAM:
CT ANGIOGRAPHY HEAD AND NECK
TECHNIQUE: Multidetector CT imaging of the head and neck was performed using
the standard protocol during bolus administration of intravenous
contrast. Multiplanar CT image reconstructions and MIPs were
obtained to evaluate the vascular anatomy. Carotid stenosis
measurements (when applicable) are obtained utilizing NASCET
criteria, using the distal internal carotid diameter as the
denominator.

[Series 9: cta head & neck · axial · 0.47mm/px · z∈[-141,+58]mm · 4 of 665 slices shown]
[im 133/665  soft-tissue]
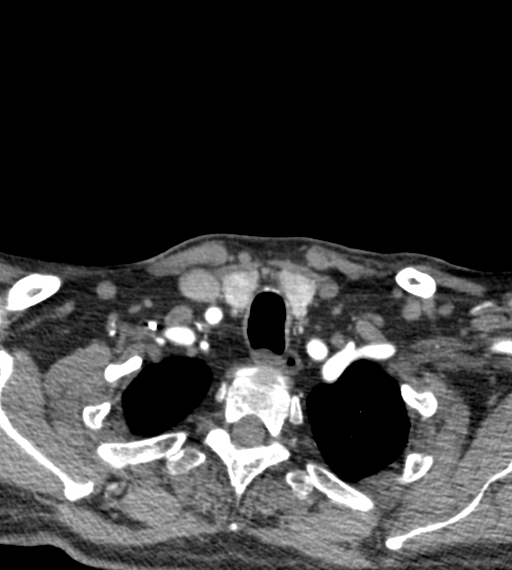
[im 266/665  bone]
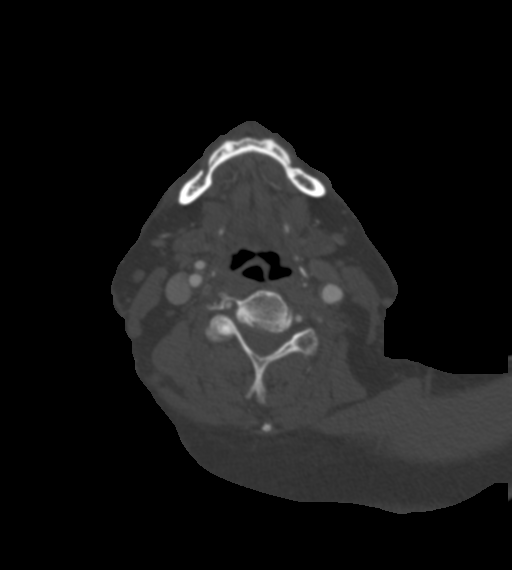
[im 399/665  soft-tissue]
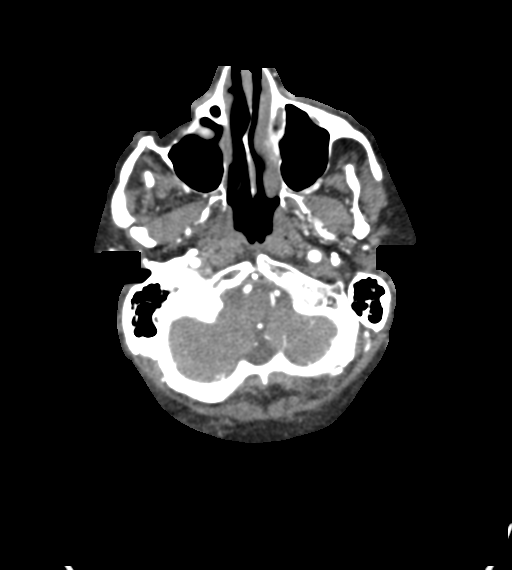
[im 532/665  bone]
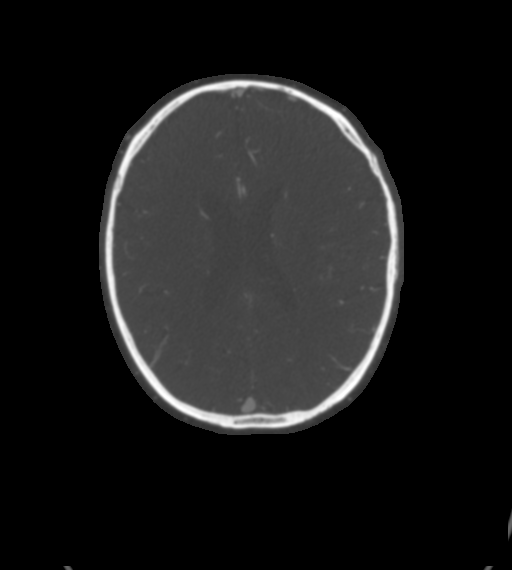

[Series 10: ax thins · axial · 0.47mm/px · z∈[-97,+14]mm · 2 of 333 slices shown]
[im 111/333  soft-tissue]
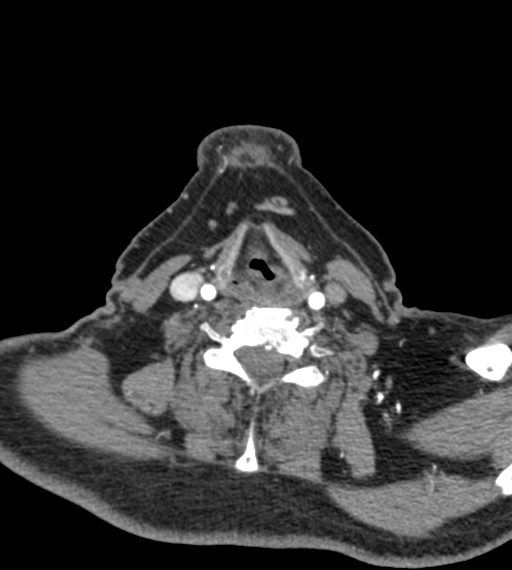
[im 222/333  soft-tissue]
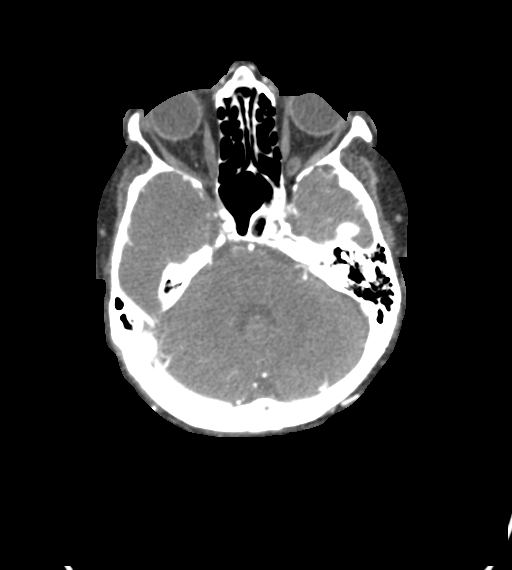

[6 of 33 positions shown; findings below may reference images not displayed]

RADIATION DOSE REDUCTION: This exam was performed according to the
departmental dose-optimization program which includes automated
exposure control, adjustment of the mA and/or kV according to
patient size and/or use of iterative reconstruction technique.

CONTRAST:  75mL OMNIPAQUE IOHEXOL 350 MG/ML SOLN
FINDINGS: CT HEAD FINDINGS

Brain: No evidence of acute infarction, hemorrhage, hydrocephalus,
extra-axial collection or mass lesion/mass effect.

Mild patchy white matter hypodensity bilaterally. Small
hypodensities in the basal ganglia bilaterally most likely due to
chronic microvascular ischemia.

Vascular: Negative for hyperdense vessel

Skull: Negative

Sinuses: Paranasal sinuses clear.

Orbits: Negative

Review of the MIP images confirms the above findings

CTA NECK FINDINGS

Aortic arch: Normal aortic arch. Proximal great vessels widely
patent

Right carotid system: Normal right carotid system. Negative for
atherosclerotic disease or dissection.

Left carotid system: Normal left carotid system. Negative for
atherosclerotic disease or dissection.

Vertebral arteries: Normal vertebral arteries bilaterally which are
widely patent to the skull base.

Skeleton: Mild degenerative change cervical spine without acute
skeletal abnormality.

Other neck: Negative for mass or edema in the neck.

Upper chest: Lung apices clear bilaterally.

Review of the MIP images confirms the above findings

CTA HEAD FINDINGS

Anterior circulation: Internal carotid artery widely patent through
the skull base and cavernous segment. Hypoplastic right A1 segment.
Both anterior cerebral arteries are patent supplied from the left.

Right M1 segment widely patent. Right middle cerebral artery
branches patent without significant stenosis

Mild stenosis left M1 segment. Left middle cerebral artery branches
are patent without stenosis.

Posterior circulation: Both vertebral arteries are patent to the
basilar without significant stenosis. PICA patent bilaterally.
Basilar widely patent. Basilar ends in the superior cerebellar
arteries bilaterally. Fetal origin of the posterior cerebral artery
bilaterally. Mild stenosis right P2 segment and moderate stenosis
left P1 and P2 segment.

Venous sinuses: Normal venous enhancement.

Anatomic variants: None

Review of the MIP images confirms the above findings
IMPRESSION: 1. CT head negative for acute abnormality. Chronic microvascular
ischemic change in the white matter and basal ganglia bilaterally.
2. No carotid or vertebral artery stenosis in the neck
3. Negative for intracranial large vessel occlusion.
4. Intracranial atherosclerotic disease with mild stenosis left M1
segment, mild stenosis right P1 segment and moderate stenosis left
P1 and P2 segments.
5. These results were called by telephone at the time of
, who verbally acknowledged these results.

## 2023-01-13 IMAGING — MR MR HEAD W/O CM
11 of 12 series · 41 of 48 positions shown · non-contrast
Comparison: CTA from earlier today

CLINICAL DATA: TIA. Severe right-sided headache with confusion and
speech difficulty

EXAM:
MRI HEAD WITHOUT CONTRAST
TECHNIQUE: Multiplanar, multiecho pulse sequences of the brain and surrounding
structures were obtained without intravenous contrast.

[Series 5: DWI · axial · 4.0mm · 0.88mm/px · z∈[-68,+72]mm · 4 of 36 slices shown (1 of 6)]
[im 1/36]
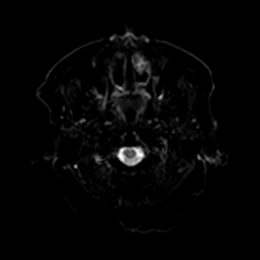
[im 12/36]
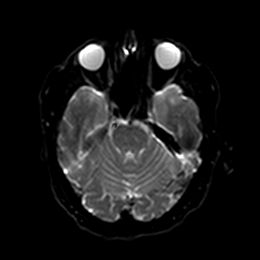
[im 24/36]
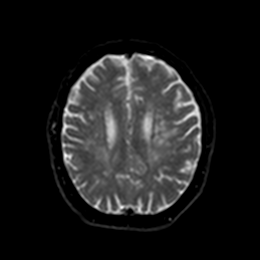
[im 36/36]
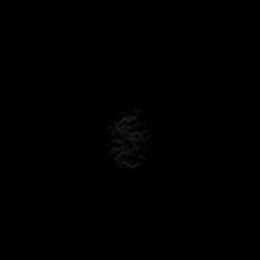

[Series 5: DWI · axial · 4.0mm · 0.88mm/px · z∈[-68,+72]mm · 4 of 36 slices shown (2 of 6)]
[im 1/36]
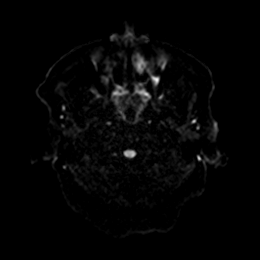
[im 12/36]
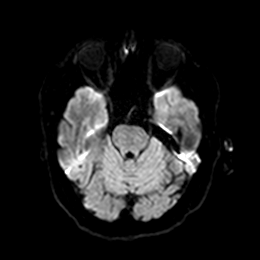
[im 24/36]
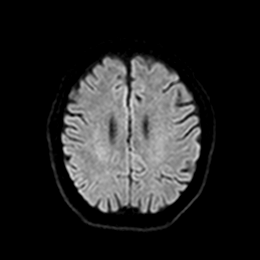
[im 36/36]
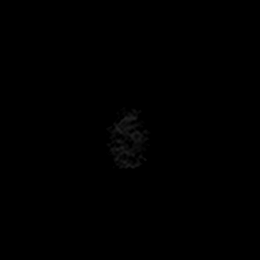

[Series 6: DWI · axial · 4.0mm · 0.88mm/px · z∈[-68,+72]mm · 4 of 34 slices shown (3 of 6)]
[im 1/34]
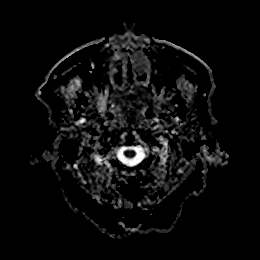
[im 12/34]
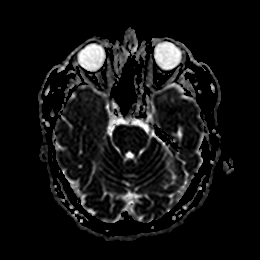
[im 23/34]
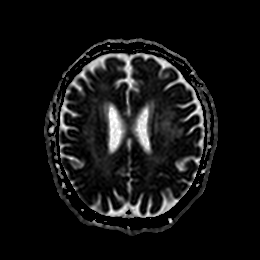
[im 34/34]
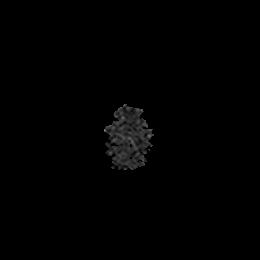

[Series 7: DWI · coronal · 4.0mm · 0.88mm/px · 4 of 32 slices shown (4 of 6)]
[im 1/32]
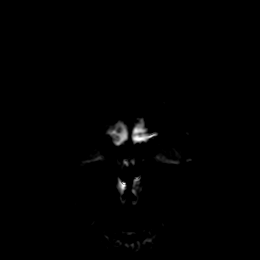
[im 11/32]
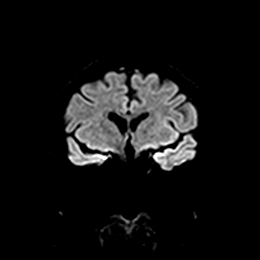
[im 21/32]
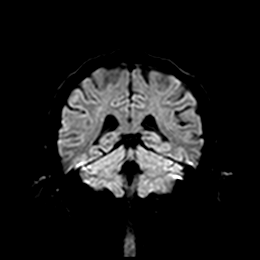
[im 32/32]
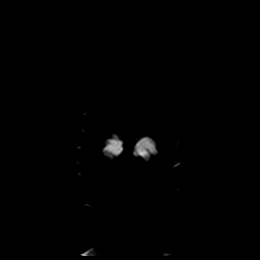

[Series 7: DWI · coronal · 4.0mm · 0.88mm/px · 4 of 32 slices shown (5 of 6)]
[im 1/32]
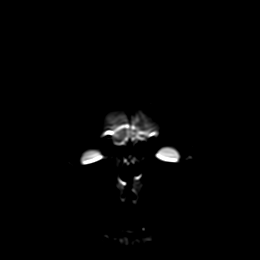
[im 11/32]
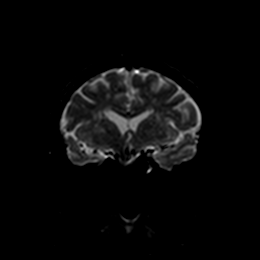
[im 21/32]
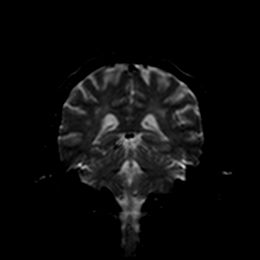
[im 32/32]
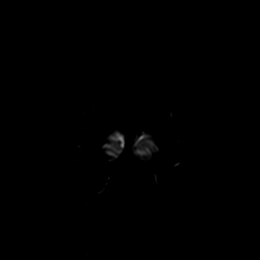

[Series 8: DWI · coronal · 4.0mm · 0.88mm/px · 4 of 32 slices shown (6 of 6)]
[im 1/32]
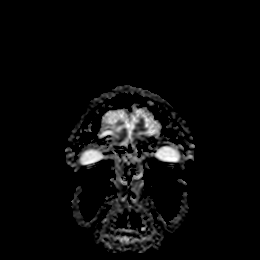
[im 11/32]
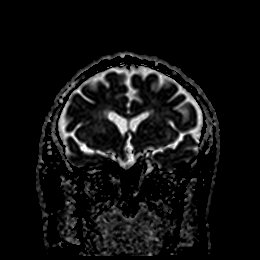
[im 21/32]
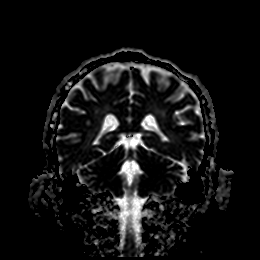
[im 32/32]
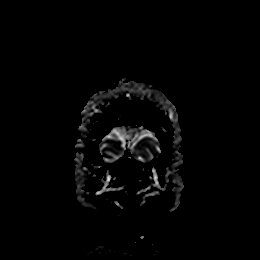

[Series 9: T1 · sagittal · 5.0mm · 0.80mm/px · 3 of 23 slices shown]
[im 1/23]
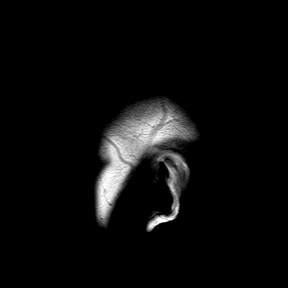
[im 12/23]
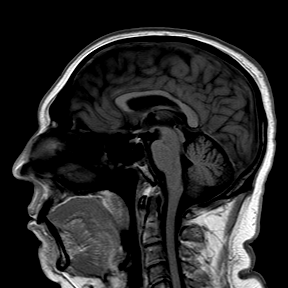
[im 23/23]
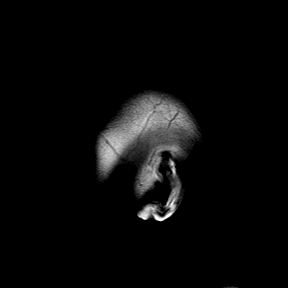

[Series 10: T2 · axial · 5.0mm · 0.72mm/px · z∈[-75,+79]mm · 3 of 23 slices shown (1 of 2)]
[im 1/23]
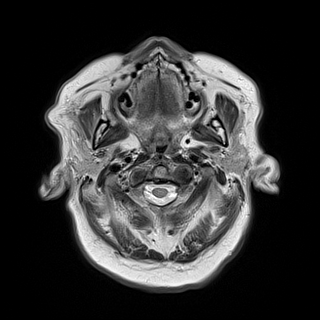
[im 12/23]
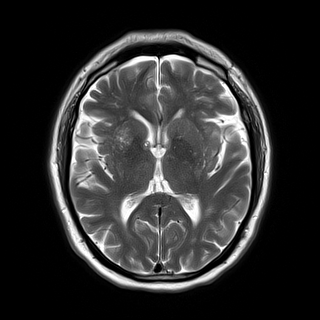
[im 23/23]
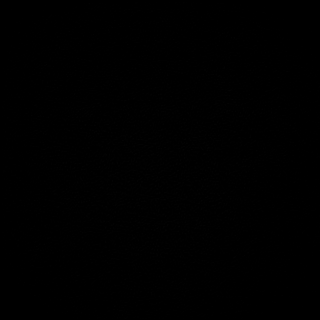

[Series 11: ax hemo · axial · 5.0mm · 0.86mm/px · z∈[-70,+74]mm · 3 of 25 slices shown]
[im 1/25]
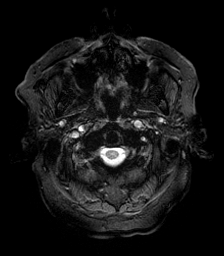
[im 13/25]
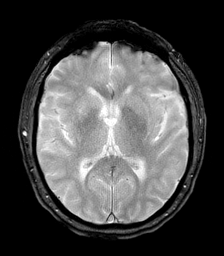
[im 25/25]
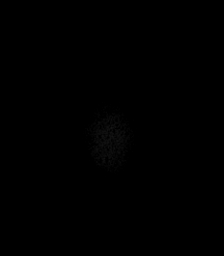

[Series 12: FLAIR · axial · 4.0mm · 0.43mm/px · z∈[-72,+76]mm · 5 of 38 slices shown]
[im 1/38]
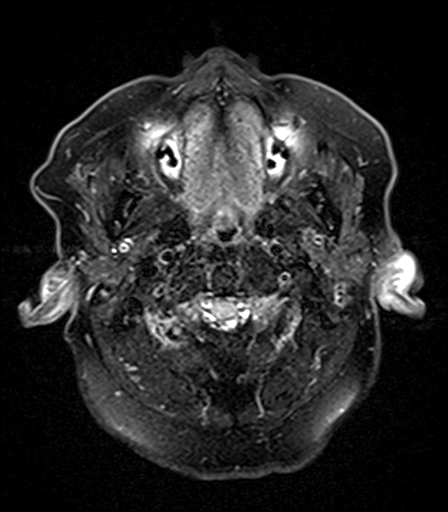
[im 10/38]
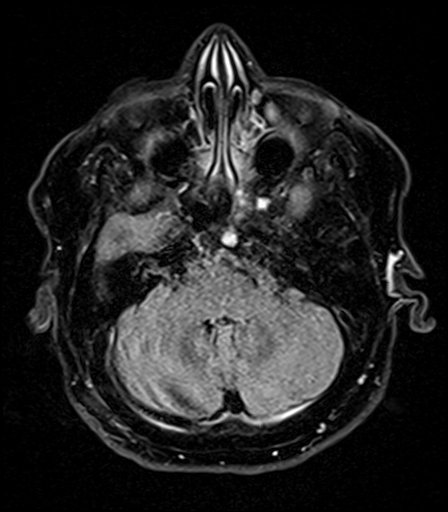
[im 19/38]
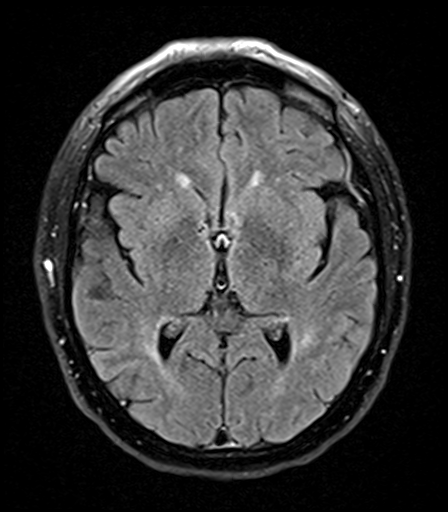
[im 28/38]
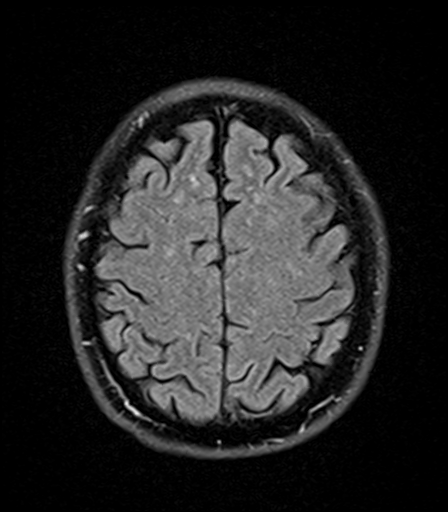
[im 38/38]
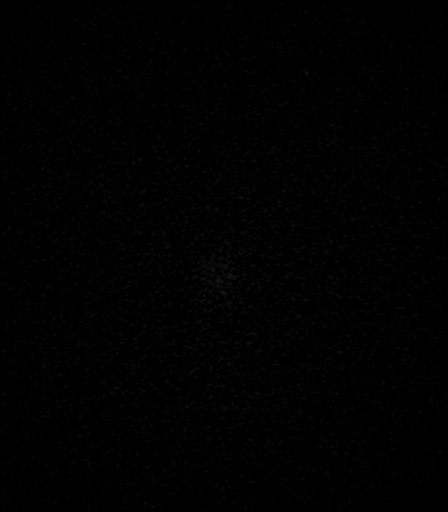

[Series 14: T2 · coronal · 5.0mm · 0.72mm/px · 3 of 28 slices shown (2 of 2)]
[im 1/28]
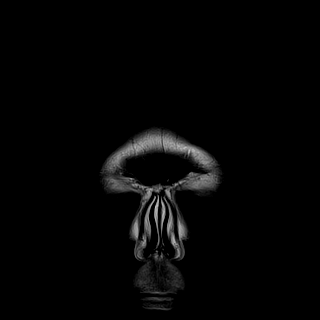
[im 14/28]
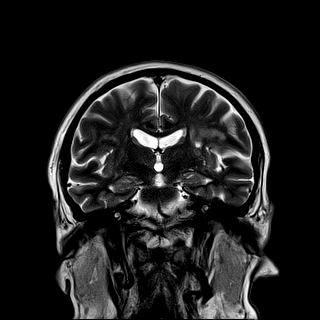
[im 28/28]
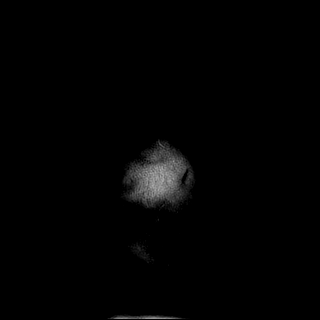

[41 of 48 positions shown; findings below may reference images not displayed]

FINDINGS: Brain: No acute infarction, hemorrhage, hydrocephalus, extra-axial
collection or mass lesion. Chronic small vessel ischemic type
gliosis in the cerebral white matter with chronic lacune at the left
corona radiata. Normal brain volume. No chronic blood products.

Vascular: Major flow voids are preserved.  CTA performed yesterday

Skull and upper cervical spine: Normal marrow signal

Sinuses/Orbits: Bilateral cataract resection. Comparatively small
left sphenoid sinus containing secretions, also seen on prior.
IMPRESSION: 1. No acute finding.
2. Moderate chronic small vessel ischemia.
